# Patient Record
Sex: Female | Born: 1935
Health system: Southern US, Community
[De-identification: ages and names within clinical notes are randomized; demographics above are authoritative.]

## PROBLEM LIST (undated history)

## (undated) DIAGNOSIS — K259 Gastric ulcer, unspecified as acute or chronic, without hemorrhage or perforation: Secondary | ICD-10-CM

## (undated) DIAGNOSIS — M549 Dorsalgia, unspecified: Secondary | ICD-10-CM

## (undated) DIAGNOSIS — Z860100 Personal history of colon polyps, unspecified: Secondary | ICD-10-CM

## (undated) DIAGNOSIS — K5790 Diverticulosis of intestine, part unspecified, without perforation or abscess without bleeding: Secondary | ICD-10-CM

## (undated) DIAGNOSIS — D509 Iron deficiency anemia, unspecified: Secondary | ICD-10-CM

## (undated) DIAGNOSIS — K219 Gastro-esophageal reflux disease without esophagitis: Secondary | ICD-10-CM

## (undated) DIAGNOSIS — M2540 Effusion, unspecified joint: Secondary | ICD-10-CM

## (undated) DIAGNOSIS — G629 Polyneuropathy, unspecified: Secondary | ICD-10-CM

## (undated) DIAGNOSIS — K648 Other hemorrhoids: Secondary | ICD-10-CM

## (undated) DIAGNOSIS — I10 Essential (primary) hypertension: Secondary | ICD-10-CM

## (undated) DIAGNOSIS — M2550 Pain in unspecified joint: Secondary | ICD-10-CM

## (undated) DIAGNOSIS — M1990 Unspecified osteoarthritis, unspecified site: Secondary | ICD-10-CM

## (undated) DIAGNOSIS — Z8719 Personal history of other diseases of the digestive system: Secondary | ICD-10-CM

## (undated) DIAGNOSIS — R600 Localized edema: Secondary | ICD-10-CM

## (undated) DIAGNOSIS — M8580 Other specified disorders of bone density and structure, unspecified site: Secondary | ICD-10-CM

## (undated) DIAGNOSIS — K222 Esophageal obstruction: Secondary | ICD-10-CM

## (undated) DIAGNOSIS — I341 Nonrheumatic mitral (valve) prolapse: Secondary | ICD-10-CM

## (undated) DIAGNOSIS — Z889 Allergy status to unspecified drugs, medicaments and biological substances status: Secondary | ICD-10-CM

## (undated) DIAGNOSIS — D171 Benign lipomatous neoplasm of skin and subcutaneous tissue of trunk: Secondary | ICD-10-CM

## (undated) DIAGNOSIS — M4800 Spinal stenosis, site unspecified: Secondary | ICD-10-CM

## (undated) DIAGNOSIS — R609 Edema, unspecified: Secondary | ICD-10-CM

## (undated) DIAGNOSIS — Z87440 Personal history of urinary (tract) infections: Secondary | ICD-10-CM

## (undated) HISTORY — DX: Spinal stenosis, site unspecified: M48.00

## (undated) HISTORY — DX: Esophageal obstruction: K22.2

## (undated) HISTORY — PX: ESOPHAGOGASTRODUODENOSCOPY: SHX1529

## (undated) HISTORY — DX: Essential (primary) hypertension: I10

## (undated) HISTORY — DX: Nonrheumatic mitral (valve) prolapse: I34.1

## (undated) HISTORY — PX: IRRIGATION AND DEBRIDEMENT SEBACEOUS CYST: SHX5255

## (undated) HISTORY — DX: Iron deficiency anemia, unspecified: D50.9

## (undated) HISTORY — DX: Diverticulosis of intestine, part unspecified, without perforation or abscess without bleeding: K57.90

## (undated) HISTORY — DX: Gastro-esophageal reflux disease without esophagitis: K21.9

## (undated) HISTORY — PX: COLONOSCOPY: SHX174

## (undated) HISTORY — DX: Other hemorrhoids: K64.8

## (undated) HISTORY — DX: Unspecified osteoarthritis, unspecified site: M19.90

## (undated) HISTORY — DX: Polyneuropathy, unspecified: G62.9

## (undated) HISTORY — PX: REPLACEMENT TOTAL KNEE: SUR1224

## (undated) HISTORY — DX: Benign lipomatous neoplasm of skin and subcutaneous tissue of trunk: D17.1

## (undated) HISTORY — DX: Other specified disorders of bone density and structure, unspecified site: M85.80

## (undated) HISTORY — PX: BACK SURGERY: SHX140

---

## 1973-10-19 HISTORY — PX: BREAST EXCISIONAL BIOPSY: SUR124

## 1998-02-12 ENCOUNTER — Other Ambulatory Visit: Admission: RE | Admit: 1998-02-12 | Discharge: 1998-02-12 | Payer: Self-pay | Admitting: Gynecology

## 1999-04-28 ENCOUNTER — Other Ambulatory Visit: Admission: RE | Admit: 1999-04-28 | Discharge: 1999-04-28 | Payer: Self-pay | Admitting: Gynecology

## 2000-06-03 ENCOUNTER — Encounter: Payer: Self-pay | Admitting: Geriatric Medicine

## 2000-06-03 ENCOUNTER — Encounter: Admission: RE | Admit: 2000-06-03 | Discharge: 2000-06-03 | Payer: Self-pay | Admitting: Geriatric Medicine

## 2000-06-15 ENCOUNTER — Encounter: Admission: RE | Admit: 2000-06-15 | Discharge: 2000-06-15 | Payer: Self-pay | Admitting: Geriatric Medicine

## 2000-06-15 ENCOUNTER — Encounter: Payer: Self-pay | Admitting: Geriatric Medicine

## 2000-06-17 ENCOUNTER — Other Ambulatory Visit: Admission: RE | Admit: 2000-06-17 | Discharge: 2000-06-17 | Payer: Self-pay | Admitting: Gynecology

## 2002-11-29 ENCOUNTER — Encounter: Admission: RE | Admit: 2002-11-29 | Discharge: 2002-11-29 | Payer: Self-pay | Admitting: Geriatric Medicine

## 2002-11-29 ENCOUNTER — Encounter: Payer: Self-pay | Admitting: Geriatric Medicine

## 2002-12-07 ENCOUNTER — Encounter: Payer: Self-pay | Admitting: Geriatric Medicine

## 2002-12-07 ENCOUNTER — Encounter: Admission: RE | Admit: 2002-12-07 | Discharge: 2002-12-07 | Payer: Self-pay | Admitting: Geriatric Medicine

## 2003-02-21 ENCOUNTER — Other Ambulatory Visit: Admission: RE | Admit: 2003-02-21 | Discharge: 2003-02-21 | Payer: Self-pay | Admitting: Geriatric Medicine

## 2003-05-23 ENCOUNTER — Ambulatory Visit (HOSPITAL_BASED_OUTPATIENT_CLINIC_OR_DEPARTMENT_OTHER): Admission: RE | Admit: 2003-05-23 | Discharge: 2003-05-23 | Payer: Self-pay | Admitting: *Deleted

## 2004-05-22 ENCOUNTER — Encounter: Admission: RE | Admit: 2004-05-22 | Discharge: 2004-05-22 | Payer: Self-pay | Admitting: Geriatric Medicine

## 2004-09-30 ENCOUNTER — Ambulatory Visit: Payer: Self-pay | Admitting: Internal Medicine

## 2004-12-01 ENCOUNTER — Ambulatory Visit (HOSPITAL_COMMUNITY): Admission: RE | Admit: 2004-12-01 | Discharge: 2004-12-01 | Payer: Self-pay | Admitting: Internal Medicine

## 2004-12-12 ENCOUNTER — Encounter: Admission: RE | Admit: 2004-12-12 | Discharge: 2004-12-12 | Payer: Self-pay | Admitting: Geriatric Medicine

## 2005-02-24 ENCOUNTER — Other Ambulatory Visit: Admission: RE | Admit: 2005-02-24 | Discharge: 2005-02-24 | Payer: Self-pay | Admitting: Geriatric Medicine

## 2005-03-13 ENCOUNTER — Encounter: Admission: RE | Admit: 2005-03-13 | Discharge: 2005-03-13 | Payer: Self-pay | Admitting: Geriatric Medicine

## 2005-10-07 ENCOUNTER — Encounter: Admission: RE | Admit: 2005-10-07 | Discharge: 2005-10-07 | Payer: Self-pay | Admitting: Geriatric Medicine

## 2006-01-05 ENCOUNTER — Inpatient Hospital Stay (HOSPITAL_COMMUNITY): Admission: RE | Admit: 2006-01-05 | Discharge: 2006-01-06 | Payer: Self-pay | Admitting: Neurosurgery

## 2006-02-07 ENCOUNTER — Encounter: Admission: RE | Admit: 2006-02-07 | Discharge: 2006-02-07 | Payer: Self-pay | Admitting: Neurosurgery

## 2006-02-09 ENCOUNTER — Encounter: Admission: RE | Admit: 2006-02-09 | Discharge: 2006-02-09 | Payer: Self-pay | Admitting: Geriatric Medicine

## 2006-02-15 ENCOUNTER — Ambulatory Visit (HOSPITAL_COMMUNITY): Admission: RE | Admit: 2006-02-15 | Discharge: 2006-02-16 | Payer: Self-pay | Admitting: Neurosurgery

## 2006-03-18 ENCOUNTER — Encounter: Admission: RE | Admit: 2006-03-18 | Discharge: 2006-03-18 | Payer: Self-pay | Admitting: Neurosurgery

## 2006-08-25 ENCOUNTER — Inpatient Hospital Stay (HOSPITAL_COMMUNITY): Admission: RE | Admit: 2006-08-25 | Discharge: 2006-08-29 | Payer: Self-pay | Admitting: Orthopaedic Surgery

## 2006-09-14 ENCOUNTER — Inpatient Hospital Stay (HOSPITAL_COMMUNITY): Admission: EM | Admit: 2006-09-14 | Discharge: 2006-09-16 | Payer: Self-pay | Admitting: Emergency Medicine

## 2007-03-24 ENCOUNTER — Encounter: Admission: RE | Admit: 2007-03-24 | Discharge: 2007-03-24 | Payer: Self-pay | Admitting: Geriatric Medicine

## 2008-04-26 ENCOUNTER — Encounter: Admission: RE | Admit: 2008-04-26 | Discharge: 2008-04-26 | Payer: Self-pay | Admitting: Geriatric Medicine

## 2008-05-10 ENCOUNTER — Encounter: Admission: RE | Admit: 2008-05-10 | Discharge: 2008-05-10 | Payer: Self-pay | Admitting: Geriatric Medicine

## 2008-06-23 ENCOUNTER — Emergency Department (HOSPITAL_COMMUNITY): Admission: EM | Admit: 2008-06-23 | Discharge: 2008-06-23 | Payer: Self-pay | Admitting: Emergency Medicine

## 2008-11-27 ENCOUNTER — Other Ambulatory Visit: Admission: RE | Admit: 2008-11-27 | Discharge: 2008-11-27 | Payer: Self-pay | Admitting: Geriatric Medicine

## 2009-05-20 ENCOUNTER — Encounter: Admission: RE | Admit: 2009-05-20 | Discharge: 2009-05-20 | Payer: Self-pay | Admitting: Geriatric Medicine

## 2009-06-03 ENCOUNTER — Encounter: Admission: RE | Admit: 2009-06-03 | Discharge: 2009-06-03 | Payer: Self-pay | Admitting: Geriatric Medicine

## 2010-03-28 ENCOUNTER — Encounter (INDEPENDENT_AMBULATORY_CARE_PROVIDER_SITE_OTHER): Payer: Self-pay | Admitting: *Deleted

## 2010-06-26 ENCOUNTER — Encounter: Admission: RE | Admit: 2010-06-26 | Discharge: 2010-06-26 | Payer: Self-pay | Admitting: Geriatric Medicine

## 2010-11-10 ENCOUNTER — Encounter: Payer: Self-pay | Admitting: Geriatric Medicine

## 2010-11-18 NOTE — Letter (Signed)
Summary: Colonoscopy Date Change Letter  [facility]  [address] [place], [place] [address]   Phone: [phone]  Fax: [phone]      March 28, 2010 MRN: [id number]   [facility] [place] [address] [place], [place]  [address]   Dear Ms. Fulghum,   Previously you were recommended to have a repeat colonoscopy around this time. Your chart was recently reviewed by Dr. Judie Petit T. Russella Dar of [facility]. Follow up colonoscopy is now recommended in July 2014. This revised recommendation is based on current, nationally recognized guidelines for colorectal cancer screening and polyp surveillance. These guidelines are endorsed by the American Cancer Society, [employer] on Colorectal Cancer as well as numerous other major medical organizations.  Please understand that our recommendation assumes that you do not have any new symptoms such as bleeding, a change in bowel habits, anemia, or significant abdominal discomfort. If you do have any concerning GI symptoms or want to discuss the guideline recommendations, please call to arrange an office visit at your earliest convenience. Otherwise we will keep you in our reminder system and contact you 1-2 months prior to the date listed above to schedule your next colonoscopy.  Thank you,  Judie Petit T. Russella Dar, M.D.  [facility] [phone]

## 2011-03-06 NOTE — Op Note (Signed)
NAME:  Ashley Cabrera, BHAT              ACCOUNT NO.:  [id number]   MEDICAL RECORD NO.:  [id number]          PATIENT TYPE:  INP   LOCATION:  NA                           FACILITY:  [facility]   PHYSICIAN:  Henry A. Pool, M.D.    DATE OF BIRTH:  [birth year]   DATE OF PROCEDURE:  01/05/2006  DATE OF DISCHARGE:                                 OPERATIVE REPORT   PREOPERATIVE DIAGNOSIS:  L4-5 stenosis.   POSTOPERATIVE DIAGNOSIS:  L4-5 stenosis.   OPERATION PERFORMED:  L4-5 bilateral decompressive laminotomies with  foraminotomies.   SURGEON:  Kathaleen Maser. Pool, M.D.   ASSISTANT:  Reinaldo Meeker, M.D.   ANESTHESIA:  General oral endotracheal.   INDICATIONS FOR PROCEDURE:  The patient is a 77 year old female with history  of back and bilateral lower extremity pain failing conservative management.  Work-up demonstrates evidence of marked multilevel spondylosis with severe  stenosis at the L4-5 level.  The patient presents now for L4-5 decompression  in hopes of improving her symptoms.   DESCRIPTION OF PROCEDURE:  The patient was taken to the operating room and  placed on the table in the supine position.  After adequate level of  anesthesia was achieved, the patient was positioned prone onto a Wilson  frame and appropriately padded.  The patient's lumbar region was prepped and  draped sterilely.  A 10 blade was used to make a linear skin incision  overlying the L4-5 interspace.  This was carried down sharply in the  midline.  A subperiosteal dissection was then performed exposing the lamina  and facet joints of L4 and L5.  Deep self-retaining retractor was placed.  Intraoperative x-ray was taken, the level was confirmed.  The laminotomy was  then performed bilaterally using high speed drill and Kerrison rongeurs to  remove the inferior edge of the lamina at L4, medial aspect of the L4-5  facet joint and superior rim of the L5 lamina.  Ligamentum flavum was then  elevated and resected in piecemeal  fashion using Kerrison rongeurs.  Underlying thecal sac and exiting L4 and L5 nerve roots were identified  bilaterally.  Wide decompressive foraminotomies were performed along the  course of the exiting nerve roots bilaterally.  At this point a very  thorough diskectomy had been performed.  There was no evidence of injury to  thecal sac or nerve roots.  The wound was then irrigated with antibiotic  solution.  Gelfoam was placed topically for hemostasis which was found to be  good.  Microscope and retractor system were removed.  Hemostasis in muscle  was achieved with electrocautery.  The wound was then closed in layers with  Vicryl sutures.  Steri-Strips and sterile dressing were applied.  There were  no apparent complications.  The patient tolerated the procedure well and she  returned to the recovery room postoperatively.          ______________________________  Kathaleen Maser Pool, M.D.    HAP/MEDQ  D:  01/05/2006  T:  01/06/2006  Job:  [id number]

## 2011-03-06 NOTE — H&P (Signed)
NAME:  Ashley Cabrera              ACCOUNT NO.:  [id number]   MEDICAL RECORD NO.:  [id number]          PATIENT TYPE:  INP   LOCATION:  5739                         FACILITY:  [facility]   PHYSICIAN:  Hal T. Stoneking, M.D. DATE OF BIRTH:  11-28-1933   DATE OF ADMISSION:  09/14/2006  DATE OF DISCHARGE:                                HISTORY & PHYSICAL   .   IDENTIFYING DATA:  Ashley Cabrera is a very nice 77 year old white female who  lives at home independently.  Ashley Cabrera was in her usual state of health  about 3 weeks ago. She had a left total knee replacement and has been at  home doing well with physical therapy.  Last evening she noted a burning  sensation on urination and today felt poorly, had a fever as high as 102.  She had nausea and vomiting at least once today, really did not eat or drink  today.  She stood up to reach for her walker, lost her balance, recalled the  fall.  Came to the emergency room for evaluation.  During the time she had  the fever, she was somewhat disoriented, was unable to speak very clearly,  and she recalls this episode.  She has had no diarrhea.  No cough.   ALLERGIES:  She has no known drug allergies.   CURRENT MEDICATIONS:  1. Prilosec over-the-counter 20 mg a day.  2. Cartia XT 120 mg a day.  3. Singulair 10 mg a day.  4. Coumadin, which she just completed yesterday.  5. Darvocet-N 100 p.r.n.  6. Phenergan p.r.n. nausea and vomiting.   PAST MEDICAL HISTORY:  Remarkable for:  1. GERD.  2. Mitral valve prolapse with a history of palpitations for which she      takes Cartia XT.  3. Allergic rhinitis.   She has had no history of MI, stroke or cancer.   PAST SURGERIES:  1. She has had two back surgeries.  2. Arthroscopic knee surgery.  3. Recent total knee replacement by Dr. Ophelia Charter.   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  Does not drink, does not consume alcohol.  She has two children, both  adopted.   FAMILY HISTORY:  Her mother died with lymphoma.  Father was my patient. Mr.  Mack Cabrera died of prostate cancer, had hypertension.  Brother, Ashley Cabrera, is also  my patient and has had hypertension, memory loss and a transverse myelitis.   REVIEW OF SYSTEMS:  She has had a frontal headache.  No change in vision or  hearing.  No trouble chewing or swallowing. A little bit of a dry,  nonproductive cough.  She has had no chest pain, no abdominal pain.  No  change in bowel habits.   PHYSICAL EXAMINATION:  VITAL SIGNS: Temperature 98.2, pulse 100, blood  pressure 123/54.  SKIN: Warm and dry.  HEENT: Pupils equal and reactive to light.  Disks are sharp. Vasculature  appears normal.  TMs normal.  Oropharynx clear.  LUNGS: Clear.  HEART: Regular rate and rhythm without murmurs, rubs or gallops.  ABDOMEN: Soft.  No pedal splenomegaly or masses palpated.  EXTREMITIES: No lower extremity edema.  Examination of left knee reveals the  incision to be healed.  No erythema or warmth.   LABORATORY DATA:  Urinalysis revealed it too-numerous-to-count wbc's. CBC:  White count elevated at 24,000 with 87% neutrophils, 4% lymphs, 9%  monocytes.  Sodium 136, potassium 3.9, chloride 102, bicarb 21, glucose 141,  BUN 16, creatinine 1.1, calcium 9.3, total protein 6.9, albumin 3.4, SGOT  33, SGPT 32, alkaline phosphatase 73.   Chest x-ray revealed no active disease.   INR 1.7.   ASSESSMENT:  1. Febrile illness, likely due to urinary tract infection and      pyelonephritis due to recent Foley catheter.  This resulted in fever,      dehydration and a brief episode of delirium which is not clear.  2. Status post left total knee replacement. Looks very good. Will need to      continue her physical therapy. See no further need continue the      Coumadin as she has been up and ambulating.   PLAN:  1. Will admit.  2. Will begin IV Cipro.  3. Culture, urine.  4. Hydrate.  5. PT consult.  6. Switch to p.o. Cipro when  doing well and then discharge home.           ______________________________  Hal T. Pete Glatter, M.D.     HTS/MEDQ  D:  09/14/2006  T:  09/15/2006  Job:  [id number]   cc:   Loraine Leriche C. Ophelia Charter, M.D.

## 2011-03-06 NOTE — Discharge Summary (Signed)
NAME:  Ashley Cabrera, Ashley Cabrera              ACCOUNT NO.:  [id number]   MEDICAL RECORD NO.:  [id number]          PATIENT TYPE:  INP   LOCATION:  5003                         FACILITY:  [facility]   PHYSICIAN:  Mark C. Ophelia Charter, M.D.    DATE OF BIRTH:  03-31-1934   DATE OF ADMISSION:  08/25/2006  DATE OF DISCHARGE:  08/29/2006                               DISCHARGE SUMMARY   FINAL DIAGNOSIS:  Left knee osteoarthritis.   ADDITIONAL DIAGNOSES:  1. Postoperative anemia secondary to blood loss.  2. Diaphragmatic hernia.  3. Mitral valve disorder.   PROCEDURE:  Left total knee arthroplasty, that was on August 25, 2006.   HISTORY:  This is a 77 year old female with progressive tricompartmental  degenerative arthritis and had previous arthroscopy in August with  debridement of degenerative meniscal tears and has not been able to  progress due to recurrent effusion, pain, catching and giving way on  bone-on-bone changes, worse on lateral than _medial_________  compartment with large osteophytes and subchondral sclerosis.  She was  admitted for total knee arthroplasty.  This was computer-assisted  cemented  Dupuy rotating platform done August 25, 2006.  Postoperatively, she received preoperative antibiotics, postoperative  DVT prophylaxis with Coumadin, OT/PT consultation, home care needs,  physical therapy.  She was mobilized gradually using CPM, taking 5 mg of  Coumadin a day.  Home health care was arranged.  She was independent  ambulation at the time of discharge and was ambulatory in the halls.   FOLLOWUP:  To follow up 2 weeks from the day of discharge.   CONDITION ON DISCHARGE:  Improved.      Mark C. Ophelia Charter, M.D.  Electronically Signed     MCY/MEDQ  D:  10/29/2006  T:  10/30/2006  Job:  [id number]

## 2011-03-06 NOTE — Op Note (Signed)
   NAME:  Lesperance, GAYNELL Elbert Ewings                         ACCOUNT NO.:  [id number]   MEDICAL RECORD NO.:  [id number]                   PATIENT TYPE:  AMB   LOCATION:  DSC                                  FACILITY:  [facility]   PHYSICIAN:  Vikki Ports, M.D.         DATE OF BIRTH:  1934/06/09   DATE OF PROCEDURE:  05/23/2003  DATE OF DISCHARGE:                                 OPERATIVE REPORT   PREOPERATIVE DIAGNOSIS:  Epidermoid cyst of the chest wall.   POSTOPERATIVE DIAGNOSIS:  Epidermoid cyst of the chest wall.   PROCEDURE:  Excision of epidermoid cyst of the chest wall.   ANESTHESIA:  Local.   SURGEON:  Vikki Ports, M.D.   DESCRIPTION OF PROCEDURE:  The patient was taken to the minor room at [facility]  Day [facility].  The chest was prepped and draped in normal sterile fashion.  1% lidocaine with bicarb was used to anesthetize the area.  An elliptical  incision was made around the cyst and dissected down to normal subcutaneous  fat.  The cyst was excised in its entirety and the wound was closed with  subcuticular 4-0 Monocryl.  Steri-Strips and sterile dressings were applied.  The patient tolerated the procedure well.                                               Vikki Ports, M.D.    KRH/MEDQ  D:  05/23/2003  T:  05/23/2003  Job:  [id number]

## 2011-03-06 NOTE — Op Note (Signed)
NAME:  Ashley Cabrera, Ashley Cabrera              ACCOUNT NO.:  [id number]   MEDICAL RECORD NO.:  [id number]          PATIENT TYPE:  OIB   LOCATION:  3036                         FACILITY:  [facility]   PHYSICIAN:  Sherilyn Cooter A. Pool, M.D.    DATE OF BIRTH:  1934-08-23   DATE OF PROCEDURE:  02/15/2006  DATE OF DISCHARGE:                                 OPERATIVE REPORT   SERVICE:  Neurosurgery.   PREOPERATIVE DIAGNOSIS:  Right L4-5 recurrent herniated pulposus with  radiculopathy.   POSTOPERATIVE DIAGNOSIS:  Right L4-5 recurrent herniated pulposus with  radiculopathy.   PROCEDURE:  Re-exploration of right L4-5 laminotomy with a redo  microdiskectomy.   SURGEON:  Kathaleen Maser. Pool, M.D.   ANESTHESIA:  General oroendotracheal.   INDICATIONS:  [name] is a 76 year old female who is status post a  bilateral L4-5 decompressive laminotomy with foraminotomies approximately 1  month ago.  The patient initially did well, but now has developed severe  right lower extremity radicular pain.  Workup demonstrates evidence of a new  right-sided L4-5 disk herniation with a superior free fragment causing  marked compression of the right sided L4 and L5 nerve roots.  The patient  has been as her options.  She has decided proceed with a right-sided L4-5  redo microdiskectomy in hopes of improving her symptoms.   OPERATIVE NOTE:  The patient was brought to the operating room and placed in  a supine position.  After an adequate level of anesthesia was achieved, the  patient was positioned prone onto a Wilson frame and appropriately padded.  The patient's lumbar region was shaved and prepped sterilely.  A 10 blade  was used to make a linear skin incision overlying the L4-5 interspace.  This  was carried down sharply in the midline.  Subperiosteal dissection was then  performed, exposing the lamina and facet joints at L4-5 on the right side.  A deep self-retaining retractor was placed.  Intraoperative x-rays were  taken  and level was confirmed.  Previous laminotomy was then dissected free  using dental instruments.  Epidural scar was resected.  Underlying thecal  sac and exiting L5 nerve roots were applied.  The microscope was brought  into the field to use for microdissection of the right-sided L5 nerve root  and underlying disk herniation.  Epidural venous plexus was coagulated and  cut.  Thecal sac and L5 nerve root were mobilized and retracted towards the  midline.  Disk space was isolate and incised with a 15 blade in a  rectangular fashion.  A wide disk space cleanout was achieved using  pituitary rongeurs, upward-angled pituitary rongeurs and Epstein curettes.  A large amount of calcified superior fragmented disk was encountered and  completely resected.  A free fragment of disk was also encountered and  completely resected.  At this point a very thorough diskectomy had been  achieved.  There was no injury to the thecal sac or nerve roots.  There was  no residual compression upon the thecal sac and nerve roots.  Wound was then  irrigated with antibiotic solution.  Gelfoam was placed intraoperatively  for  hemostasis, which was found to be good.  Microscope and retractor system  were removed.  Hemostasis in the  muscle was achieved with electrocautery.  Wound was closed in layers with  Vicryl sutures.  Steri-Strips and a sterile dressing were applied.  There  were no apparent intraoperative complications.  The patient tolerated the  procedure well and she returns to the recovery room postoperatively.           ______________________________  Kathaleen Maser Pool, M.D.     HAP/MEDQ  D:  02/15/2006  T:  02/16/2006  Job:  [id number]

## 2011-03-06 NOTE — Op Note (Signed)
NAME:  Ashley Cabrera, Ashley Cabrera              ACCOUNT NO.:  [id number]   MEDICAL RECORD NO.:  [id number]          PATIENT TYPE:  INP   LOCATION:  5003                         FACILITY:  [facility]   PHYSICIAN:  Mark C. Ophelia Charter, M.D.    DATE OF BIRTH:  Mar 05, 1934   DATE OF PROCEDURE:  08/25/2006  DATE OF DISCHARGE:                                 OPERATIVE REPORT   PREOPERATIVE DIAGNOSIS:  Left knee osteoarthritis.   POSTOPERATIVE DIAGNOSIS:  Left knee osteoarthritis.   PROCEDURE:  Left total knee arthroplasty, computer assist, cemented.   SURGEON:  Mark C. Ophelia Charter, M.D.   ASSISTANT:  Wende Neighbors, P.A.   ANESTHESIA:  Prep with femoral nerve block plus general anesthesia and  Marcaine skin local.   COMPONENTS USED:  DePuy, [employer], #4 femur, #3  tibia, 10 mm rotating polyethylene spacer.   PROCEDURE:  After induction of general anesthesia and orotracheal  intubation, proximal thigh tourniquet, lateral post and heel pump. Standard  prep and drape was performed with DuraPrep.  Total knee sheets, drapes,  impervious stockinette, Coban.  The leg was wrapped in Esmarch prior to  tourniquet inflation and Betadine Biodrape was applied after sterile skin  marker.   A straight anterior incision was made, superficial retinaculum was  developed.  Medial parapatellar incision was made splitting the quad between  the medial one-third and lateral two-thirds.  The patella was flipped over  and 9 mm __________ removed off the patella from facet to facet with  oscillating saw.  A 35 mm patella is appropriate, holes were drilled.  Various views for the distal femoral cut taking initially 10 mm which  appeared insufficient, an additional 2 mm were taken.  Sizing show the  appropriate #4 and after computer gave appropriate femoral rotation based on  epicondyle axis, keyhole punches were made followed by chamfer cuts and  notch cut.  On the tibial side, #3 was appropriate based on  the computer  recommendations.  A guide was used and position was within 0.5 mm of  perfect, varus-valgus was perfect and extension was neutral.  Cut was made,  there was appropriate sizing for a 3, keyhole was punched.  Trials were  inserted, it was a little bit tight in extension, medial lateral ligament  balancing was good and some meniscal remnants were removed posteriorly which  allowed for full extension and posterior capsule was stripped slightly off  the femur.  Some bone spurs removed posteriorly off the femur.  Pulsatile  lavage was used.  Cement was vacuum mixed.  Tibia was cemented first  followed by femur, spacer insertion and patella with patellar clamp.  All  excessive cement had been removed.  The computer said there was full  extension, 0.5 degrees valgus and balancing was less than 1 mm.  Once the  cement was hard, tourniquet was deflated and  hemostasis was obtained.  Deep capsule closed with absorbable #1 suture, 2-0  Vicryl superficial retinaculum, subcutaneous tissue, subcuticular skin  closure.  Marcaine infiltration, tincture of benzoin, Steri-Strips, postop  dressing and knee immobilizer.  Instrument count, needle  count correct.      Mark C. Ophelia Charter, M.D.  Electronically Signed     MCY/MEDQ  D:  08/25/2006  T:  08/26/2006  Job:  [id number]

## 2011-07-06 ENCOUNTER — Other Ambulatory Visit: Payer: Self-pay | Admitting: Geriatric Medicine

## 2011-07-06 DIAGNOSIS — Z1231 Encounter for screening mammogram for malignant neoplasm of breast: Secondary | ICD-10-CM

## 2011-07-14 ENCOUNTER — Ambulatory Visit: Payer: Self-pay

## 2011-07-22 ENCOUNTER — Ambulatory Visit
Admission: RE | Admit: 2011-07-22 | Discharge: 2011-07-22 | Disposition: A | Discharge status: Home / Self Care | Payer: Medicare Other | Source: Ambulatory Visit | Attending: Geriatric Medicine | Admitting: Geriatric Medicine

## 2011-07-22 DIAGNOSIS — Z1231 Encounter for screening mammogram for malignant neoplasm of breast: Secondary | ICD-10-CM

## 2011-07-22 LAB — COMPREHENSIVE METABOLIC PANEL
ALT: 24
AST: 29
Alkaline Phosphatase: 61
CO2: 23
Chloride: 102
GFR calc Af Amer: >60
GFR calc non Af Amer: >60
Sodium: 136
Total Bilirubin: 1.2

## 2011-07-22 LAB — URINALYSIS, ROUTINE W REFLEX MICROSCOPIC
Hgb urine dipstick: NEGATIVE
Nitrite: NEGATIVE

## 2011-07-22 LAB — CBC
MCV: 90.1
Platelets: 253
RBC: 4.31
RDW: 12.7
WBC: 13.7 — ABNORMAL HIGH

## 2011-07-22 LAB — CULTURE, BLOOD (ROUTINE X 2)

## 2011-07-22 LAB — DIFFERENTIAL
Basophils Absolute: 0
Basophils Relative: 0
Eosinophils Absolute: 0.1
Eosinophils Relative: 1

## 2012-02-01 DIAGNOSIS — G609 Hereditary and idiopathic neuropathy, unspecified: Secondary | ICD-10-CM | POA: Diagnosis not present

## 2012-02-01 DIAGNOSIS — Z1331 Encounter for screening for depression: Secondary | ICD-10-CM | POA: Diagnosis not present

## 2012-02-01 DIAGNOSIS — E782 Mixed hyperlipidemia: Secondary | ICD-10-CM | POA: Diagnosis not present

## 2012-02-01 DIAGNOSIS — Z0000 Encounter for general adult medical examination without abnormal findings: Secondary | ICD-10-CM | POA: Diagnosis not present

## 2012-02-01 DIAGNOSIS — Z79899 Other long term (current) drug therapy: Secondary | ICD-10-CM | POA: Diagnosis not present

## 2012-02-01 DIAGNOSIS — I10 Essential (primary) hypertension: Secondary | ICD-10-CM | POA: Diagnosis not present

## 2012-02-01 DIAGNOSIS — D649 Anemia, unspecified: Secondary | ICD-10-CM | POA: Diagnosis not present

## 2012-02-18 ENCOUNTER — Encounter: Payer: Self-pay | Admitting: Gastroenterology

## 2012-02-18 DIAGNOSIS — G609 Hereditary and idiopathic neuropathy, unspecified: Secondary | ICD-10-CM | POA: Diagnosis not present

## 2012-02-18 DIAGNOSIS — D509 Iron deficiency anemia, unspecified: Secondary | ICD-10-CM | POA: Diagnosis not present

## 2012-02-18 DIAGNOSIS — I10 Essential (primary) hypertension: Secondary | ICD-10-CM | POA: Diagnosis not present

## 2012-03-10 ENCOUNTER — Encounter: Payer: Self-pay | Admitting: Gastroenterology

## 2012-03-10 ENCOUNTER — Ambulatory Visit (INDEPENDENT_AMBULATORY_CARE_PROVIDER_SITE_OTHER): Payer: Medicare Other | Admitting: Gastroenterology

## 2012-03-10 VITALS — BP 134/72 | HR 80 | Ht 63.5 in | Wt 180.0 lb

## 2012-03-10 DIAGNOSIS — D509 Iron deficiency anemia, unspecified: Secondary | ICD-10-CM | POA: Diagnosis not present

## 2012-03-10 DIAGNOSIS — K219 Gastro-esophageal reflux disease without esophagitis: Secondary | ICD-10-CM | POA: Diagnosis not present

## 2012-03-10 MED ORDER — MOVIPREP 100 G PO SOLR: 1.0000 | Freq: Once | ORAL | Status: DC

## 2012-03-10 NOTE — Progress Notes (Signed)
History of Present Illness: This is a 78 year old female was recently found to have iron deficiency anemia: Hemoglobin 11.7, iron 46, transferrin 375, iron saturation 9%. She has a history of chronic GERD with recurrent peptic esophageal strictures in the 80s and 90s. She last underwent endoscopy with dilation in 1994. Her reflux symptoms are under excellent control and she has not had recurrent dysphagia. Her last colonoscopy was performed for routine screening in July 2004 showing sigmoid colon diverticulosis and internal hemorrhoids. Denies weight loss, abdominal pain, constipation, diarrhea, change in stool caliber, melena, hematochezia, nausea, vomiting, dysphagia, reflux symptoms, chest pain.  Allergies  Allergen Reactions  . Vicodin (Hydrocodone-Acetaminophen)    Outpatient Prescriptions Prior to Visit  Medication Sig Dispense Refill  . aspirin 81 MG tablet Take 81 mg by mouth daily.      . Calcium Carbonate-Vitamin D (CALTRATE 600+D PO) Take 1 tablet by mouth daily.      . diclofenac sodium (VOLTAREN) 1 % GEL Apply 1 application topically every 6 (six) hours.      Marland Kitchen diltiazem (DILACOR XR) 240 MG 24 hr capsule Take 240 mg by mouth daily.      . hydrochlorothiazide (MICROZIDE) 12.5 MG capsule Take 12.5 mg by mouth daily.      . Multiple Vitamin (MULTIVITAMIN) tablet Take 1 tablet by mouth daily.      Marland Kitchen omeprazole (PRILOSEC) 20 MG capsule Take 20 mg by mouth daily.      . pregabalin (LYRICA) 25 MG capsule Take 25 mg by mouth 2 (two) times daily.      . Cholecalciferol (VITAMIN D) 2000 UNITS CAPS Take 1 capsule by mouth daily.       Past Medical History  Diagnosis Date  . MVP (mitral valve prolapse)   . Arthritis   . GERD (gastroesophageal reflux disease)   . Peptic stricture of esophagus   . Diverticulosis   . Internal hemorrhoids   . Hypertension   . Breast lipoma     right  . Osteopenia   . Spinal stenosis   . Peripheral neuropathy   . Allergic rhinitis   . Asthma   . Iron  deficiency anemia    Past Surgical History  Procedure Date  . Breast excisional biopsy 1975    Benign right breast  . Irrigation and debridement sebaceous cyst 2004  . Back surgery 2007  . Replacement total knee 2007    left   History   Social History  . Marital Status: Widowed    Spouse Name: N/A    Number of Children: 2 A  . Years of Education: N/A   Occupational History  . Unemployed    Social History Main Topics  . Smoking status: Never Smoker   . Smokeless tobacco: Never Used  . Alcohol Use: No  . Drug Use: No  . Sexually Active: None   Other Topics Concern  . None   Social History Narrative  . None   Family History  Problem Relation Age of Onset  . Hypertension Father   . Lymphoma Mother   . Dementia Brother   . Hypertension Brother   . Coronary artery disease Brother   . Hypertension Sister     Review of Systems: Pertinent positive and negative review of systems were noted in the above HPI section. All other review of systems were otherwise negative.  Physical Exam: General: Well developed , well nourished, no acute distress Head: Normocephalic and atraumatic Eyes:  sclerae anicteric, EOMI Ears: Normal auditory acuity  Mouth: No deformity or lesions Neck: Supple, no masses or thyromegaly Lungs: Clear throughout to auscultation Heart: Regular rate and rhythm; no murmurs, rubs or bruits Abdomen: Soft, non tender and non distended. No masses, hepatosplenomegaly or hernias noted. Normal Bowel sounds Rectal: Deferred to colonoscopy Musculoskeletal: Symmetrical with no gross deformities  Skin: No lesions on visible extremities Pulses:  Normal pulses noted Extremities: No clubbing, cyanosis, edema or deformities noted Neurological: Alert oriented x 4, grossly nonfocal Cervical Nodes:  No significant cervical adenopathy Inguinal Nodes: No significant inguinal adenopathy Psychological:  Alert and cooperative. Normal mood and affect  Assessment and  Recommendations:  1. Iron deficiency anemia.  Rule out occult gastrointestinal losses. Continue daily iron and followup with Dr. Pete Glatter in 2-3 months. The risks, benefits, and alternatives to colonoscopy with possible biopsy and possible polypectomy were discussed with the patient and they consent to proceed. The risks, benefits, and alternatives to endoscopy with possible biopsy and possible dilation were discussed with the patient and they consent to proceed.   2. History of chronic GERD with prior esophageal strictures. Continue omeprazole 20 mg daily and standard antireflux measures.

## 2012-03-10 NOTE — Patient Instructions (Signed)
You have been scheduled for an endoscopy and colonoscopy with propofol. Please follow the written instructions given to you at your visit today. Please pick up your prep at the pharmacy within the next 1-3 days. Stop taking Iron supplement 5 days before your procedure. cc: Merlene Laughter, MD

## 2012-03-23 DIAGNOSIS — H43399 Other vitreous opacities, unspecified eye: Secondary | ICD-10-CM | POA: Diagnosis not present

## 2012-03-23 DIAGNOSIS — H524 Presbyopia: Secondary | ICD-10-CM | POA: Diagnosis not present

## 2012-03-23 DIAGNOSIS — H35039 Hypertensive retinopathy, unspecified eye: Secondary | ICD-10-CM | POA: Diagnosis not present

## 2012-03-23 DIAGNOSIS — H2510 Age-related nuclear cataract, unspecified eye: Secondary | ICD-10-CM | POA: Diagnosis not present

## 2012-03-29 DIAGNOSIS — M25569 Pain in unspecified knee: Secondary | ICD-10-CM | POA: Diagnosis not present

## 2012-03-29 DIAGNOSIS — M1710 Unilateral primary osteoarthritis, unspecified knee: Secondary | ICD-10-CM | POA: Diagnosis not present

## 2012-04-06 ENCOUNTER — Other Ambulatory Visit: Payer: Self-pay | Admitting: Gastroenterology

## 2012-04-06 ENCOUNTER — Encounter: Payer: Self-pay | Admitting: Gastroenterology

## 2012-04-06 ENCOUNTER — Ambulatory Visit (AMBULATORY_SURGERY_CENTER): Payer: Medicare Other | Admitting: Gastroenterology

## 2012-04-06 VITALS — BP 144/84 | HR 75 | Temp 97.3°F | Resp 20 | Ht 63.5 in | Wt 180.0 lb

## 2012-04-06 DIAGNOSIS — D509 Iron deficiency anemia, unspecified: Secondary | ICD-10-CM | POA: Diagnosis not present

## 2012-04-06 DIAGNOSIS — D126 Benign neoplasm of colon, unspecified: Secondary | ICD-10-CM | POA: Diagnosis not present

## 2012-04-06 DIAGNOSIS — I10 Essential (primary) hypertension: Secondary | ICD-10-CM | POA: Diagnosis not present

## 2012-04-06 DIAGNOSIS — K219 Gastro-esophageal reflux disease without esophagitis: Secondary | ICD-10-CM

## 2012-04-06 DIAGNOSIS — D131 Benign neoplasm of stomach: Secondary | ICD-10-CM

## 2012-04-06 DIAGNOSIS — J45909 Unspecified asthma, uncomplicated: Secondary | ICD-10-CM | POA: Diagnosis not present

## 2012-04-06 DIAGNOSIS — I059 Rheumatic mitral valve disease, unspecified: Secondary | ICD-10-CM | POA: Diagnosis not present

## 2012-04-06 DIAGNOSIS — D649 Anemia, unspecified: Secondary | ICD-10-CM | POA: Diagnosis not present

## 2012-04-06 MED ORDER — SODIUM CHLORIDE 0.9 % IV SOLN: 500.0000 mL | INTRAVENOUS | Status: DC

## 2012-04-06 NOTE — Op Note (Signed)
[facility] [address] [place], [place]  [address]  ENDOSCOPY PROCEDURE REPORT PATIENT:  Ashley Cabrera, Ashley Cabrera  MR#:  [id number] BIRTHDATE:  [birth year], 78 yrs. old  GENDER:  female ENDOSCOPIST:  Judie Petit T. Russella Dar, MD, [facility]  PROCEDURE DATE:  04/06/2012 PROCEDURE:  EGD with snare polypectomy ASA CLASS:  Class III INDICATIONS:  GERD, iron deficiency anemia MEDICATIONS:  MAC sedation, administered by CRNA, There was residual sedation effect present from prior procedure., propofol (Diprivan) 180 mg IV TOPICAL ANESTHETIC:  none DESCRIPTION OF PROCEDURE:   After the risks benefits and alternatives of the procedure were thoroughly explained, informed consent was obtained.  The LB GIF-H180 [id number] endoscope was introduced through the mouth and advanced to the second portion of the duodenum, without limitations.  The instrument was slowly withdrawn as the mucosa was fully examined. <<PROCEDUREIMAGES>> Multiple erosions were found in the fundus associated with the hiatal hernia. They were linear arrayed and erythematous.  There were multiple polyps identified. in the body of the stomach. They were 4 - 7 mm in size that had features typical for benign fundic gland polyps. 3 of the largest polyps were snared, then remvoed without cautery. Polyps were retrieved and sent to pathology. Otherwise normal stomach. The esophagus and gastroesophageal junction were completely normal in appearance.  The duodenal bulb was normal in appearance, as was the postbulbar duodenum. Retroflexed views revealed a hiatal hernia, 5 cm. The scope was then withdrawn from the patient and the procedure completed. COMPLICATIONS:  None  ENDOSCOPIC IMPRESSION: 1) Cameron erosions 2) Polyps, multiple in the body of the stomach 3) Moderate sized hiatal hernia  RECOMMENDATIONS: 1) Anti-reflux regimen 2) Await pathology results 3) Continue PPI 4) Cameron erosions are likely source of fe deficiency. Since  they are a chronic condition fe deficiency is very likely to recur.  Venita Lick. Russella Dar, MD, [facility]  CC:  Merlene Laughter, MD  n. Rosalie DoctorVenita Lick. Sterling Ucci at 04/06/2012 02:05 PM  Lio, Harrie Jeans, [phone]

## 2012-04-06 NOTE — Progress Notes (Signed)
Patient did not have preoperative order for IV antibiotic SSI prophylaxis. (G8918)  Patient did not experience any of the following events: a burn prior to discharge; a fall within the facility; wrong site/side/patient/procedure/implant event; or a hospital transfer or hospital admission upon discharge from the facility. (G8907)  

## 2012-04-06 NOTE — Patient Instructions (Signed)

## 2012-04-06 NOTE — Op Note (Signed)
[facility] [address] [place], [place]  [address]  COLONOSCOPY PROCEDURE REPORT  PATIENT:  Ashley Cabrera, Ashley Cabrera  MR#:  [id number] BIRTHDATE:  March 27, 1934, 78 yrs. old  GENDER:  female ENDOSCOPIST:  Judie Petit T. Russella Dar, MD, [facility]  PROCEDURE DATE:  04/06/2012 PROCEDURE:  Colonoscopy with snare polypectomy ASA CLASS:  Class III INDICATIONS:  1) Iron deficiency anemia MEDICATIONS:   MAC sedation, administered by CRNA, propofol (Diprivan) 150 mg IV DESCRIPTION OF PROCEDURE:   After the risks benefits and alternatives of the procedure were thoroughly explained, informed consent was obtained.  Digital rectal exam was performed and revealed no abnormalities.   The LB CF-H180AL [id number] endoscope was introduced through the anus and advanced to the cecum, which was identified by both the appendix and ileocecal valve, without limitations.  The quality of the prep was good, using MoviPrep. The instrument was then slowly withdrawn as the colon was fully examined. <<PROCEDUREIMAGES>> FINDINGS:  A sessile polyp was found at the hepatic flexure. It was 8 mm in size. Polyp was snared without cautery. Retrieval was successful. Moderate diverticulosis was found in the sigmoid to descending colon. Otherwise normal colonoscopy without other polyps, masses, vascular ectasias, or inflammatory changes. Retroflexed views in the rectum revealed no abnormalities.  The time to cecum =  3.75  minutes. The scope was then withdrawn (time =  9.5  min) from the patient and the procedure completed.  COMPLICATIONS:  None  ENDOSCOPIC IMPRESSION: 1) 8 mm sessile polyp at the hepatic flexure 2) Moderate diverticulosis in the sigmoid to descending colon  RECOMMENDATIONS: 1) Await pathology results 2) High fiber diet with liberal fluid intake. 3) No plans for a repeat colonoscopy for polyp surveillance or colorectal cancer screening. Colonoscopy procedures for screening or surveillance usually stop around  age 66.  Venita Lick. Russella Dar, MD, [facility]  CC:  Merlene Laughter, MD  n. Rosalie DoctorVenita Lick. [place]intyre Alexa at 04/06/2012 01:50 PM  Recore, Harrie Jeans, [phone]

## 2012-04-07 ENCOUNTER — Telehealth: Payer: Self-pay | Admitting: *Deleted

## 2012-04-07 NOTE — Telephone Encounter (Signed)
  Follow up Call-  Call back number 04/06/2012  Post procedure Call Back phone  # [phone]  Permission to leave phone message Yes     Patient questions:  Do you have a fever, pain , or abdominal swelling? no Pain Score  0 *  Have you tolerated food without any problems? yes  Have you been able to return to your normal activities? yes  Do you have any questions about your discharge instructions: Diet   no Medications  no Follow up visit  no  Do you have questions or concerns about your Care? no  Actions: * If pain score is 4 or above: No action needed, pain <4.

## 2012-04-12 ENCOUNTER — Encounter: Payer: Self-pay | Admitting: Gastroenterology

## 2012-04-26 ENCOUNTER — Other Ambulatory Visit (HOSPITAL_COMMUNITY): Payer: Self-pay | Admitting: Orthopaedic Surgery

## 2012-05-05 ENCOUNTER — Encounter (HOSPITAL_COMMUNITY): Payer: Self-pay | Admitting: Pharmacy Technician

## 2012-05-16 ENCOUNTER — Encounter (HOSPITAL_COMMUNITY): Payer: Self-pay

## 2012-05-16 ENCOUNTER — Ambulatory Visit (HOSPITAL_COMMUNITY)
Admission: RE | Admit: 2012-05-16 | Discharge: 2012-05-16 | Disposition: A | Discharge status: Home / Self Care | Payer: Medicare Other | Source: Ambulatory Visit | Attending: Orthopaedic Surgery | Admitting: Orthopaedic Surgery

## 2012-05-16 ENCOUNTER — Encounter (HOSPITAL_COMMUNITY)
Admission: RE | Admit: 2012-05-16 | Discharge: 2012-05-16 | Disposition: A | Discharge status: Home / Self Care | Payer: Medicare Other | Source: Ambulatory Visit | Attending: Orthopaedic Surgery | Admitting: Orthopaedic Surgery

## 2012-05-16 DIAGNOSIS — Z01811 Encounter for preprocedural respiratory examination: Secondary | ICD-10-CM | POA: Diagnosis not present

## 2012-05-16 DIAGNOSIS — Z01818 Encounter for other preprocedural examination: Secondary | ICD-10-CM | POA: Insufficient documentation

## 2012-05-16 DIAGNOSIS — Z01812 Encounter for preprocedural laboratory examination: Secondary | ICD-10-CM | POA: Diagnosis not present

## 2012-05-16 DIAGNOSIS — R918 Other nonspecific abnormal finding of lung field: Secondary | ICD-10-CM | POA: Diagnosis not present

## 2012-05-16 DIAGNOSIS — M4120 Other idiopathic scoliosis, site unspecified: Secondary | ICD-10-CM | POA: Insufficient documentation

## 2012-05-16 HISTORY — DX: Personal history of other diseases of the digestive system: Z87.19

## 2012-05-16 HISTORY — DX: Personal history of colon polyps, unspecified: Z86.0100

## 2012-05-16 HISTORY — DX: Localized edema: R60.0

## 2012-05-16 HISTORY — DX: Allergy status to unspecified drugs, medicaments and biological substances: Z88.9

## 2012-05-16 HISTORY — DX: Personal history of colonic polyps: Z86.010

## 2012-05-16 HISTORY — DX: Effusion, unspecified joint: M25.40

## 2012-05-16 HISTORY — DX: Pain in unspecified joint: M25.50

## 2012-05-16 HISTORY — DX: Personal history of urinary (tract) infections: Z87.440

## 2012-05-16 HISTORY — DX: Dorsalgia, unspecified: M54.9

## 2012-05-16 HISTORY — DX: Gastric ulcer, unspecified as acute or chronic, without hemorrhage or perforation: K25.9

## 2012-05-16 HISTORY — DX: Edema, unspecified: R60.9

## 2012-05-16 LAB — COMPREHENSIVE METABOLIC PANEL
ALT: 18 U/L (ref 0–35)
AST: 21 U/L (ref 0–37)
Alkaline Phosphatase: 63 U/L (ref 39–117)
CO2: 28 mEq/L (ref 19–32)
Chloride: 103 mEq/L (ref 96–112)
Creatinine, Ser: 0.77 mg/dL (ref 0.50–1.10)
GFR calc non Af Amer: 80 mL/min — ABNORMAL LOW (ref >90)
Total Bilirubin: 0.5 mg/dL (ref 0.3–1.2)

## 2012-05-16 LAB — URINALYSIS, ROUTINE W REFLEX MICROSCOPIC
Bilirubin Urine: NEGATIVE
Hgb urine dipstick: NEGATIVE
Ketones, ur: NEGATIVE mg/dL
Protein, ur: NEGATIVE mg/dL
Urobilinogen, UA: 0.2 mg/dL (ref 0.0–1.0)

## 2012-05-16 LAB — CBC
MCV: 84.7 fL (ref 78.0–100.0)
Platelets: 246 10*3/uL (ref 150–400)
RBC: 4.85 MIL/uL (ref 3.87–5.11)
WBC: 6.1 10*3/uL (ref 4.0–10.5)

## 2012-05-16 LAB — URINE MICROSCOPIC-ADD ON

## 2012-05-16 LAB — TYPE AND SCREEN: ABO/RH(D): O POS

## 2012-05-16 NOTE — Progress Notes (Signed)
Pt doesn't have a cardiologist  Denies ever having a heart cath or stress test  Echo was done >19yrs and pt states it was only done as a primary,routine exam  Medical MD is Dr.Hal Corbin Ade  Denies recent ekg or cxr

## 2012-05-16 NOTE — Pre-Procedure Instructions (Signed)
20 Ermagene L Wrage  05/16/2012   Your procedure is scheduled on:  Fri, Aug 2 @ 7:30 AM  Report to [facility] at 5:30 AM.  Call this number if you have problems the morning of surgery: [phone]   Remember:   Do not eat food:After Midnight.    Take these medicines the morning of surgery with A SIP OF WATER: Diltiazem(Dilacor XR),Omeprazole(Prilosec), and Lyrica(Pregablin)   Do not wear jewelry, make-up or nail polish.  Do not wear lotions, powders, or perfumes.   Do not shave 48 hours prior to surgery.  Do not bring valuables to the hospital.  Contacts, dentures or bridgework may not be worn into surgery.  Leave suitcase in the car. After surgery it may be brought to your room.  For patients admitted to the hospital, checkout time is 11:00 AM the day of discharge.   Patients discharged the day of surgery will not be allowed to drive home.  Special Instructions: CHG Shower Use Special Wash: 1/2 bottle night before surgery and 1/2 bottle morning of surgery.   Please read over the following fact sheets that you were given: Pain Booklet, Coughing and Deep Breathing, Blood Transfusion Information, Total Joint Packet, MRSA Information and Surgical Site Infection Prevention

## 2012-05-19 DIAGNOSIS — M1711 Unilateral primary osteoarthritis, right knee: Secondary | ICD-10-CM | POA: Diagnosis present

## 2012-05-19 MED ORDER — CEFAZOLIN SODIUM-DEXTROSE 2-3 GM-% IV SOLR
2.0000 g | INTRAVENOUS | Status: AC
  Administered 2012-05-20: 2 g via INTRAVENOUS
  Filled 2012-05-19: qty 50

## 2012-05-19 NOTE — H&P (Signed)
Ashley Cabrera is an 78 y.o. female.   Chief Complaint: right knee pain HPI:  Pt with more than 2 years history of right knee pain treated conservatively with intra articular steroid injections.  Now with no relief using injections and activity modification.  Radiographs with valgus alignment and end stage osteoarthritis changes.  Pt had previous knee replacement on the left and did well after the procedure.  She wishes to proceed with right total knee arthroplasty  Past Medical History  Diagnosis Date  . MVP (mitral valve prolapse)   . Arthritis   . Peptic stricture of esophagus   . Diverticulosis   . Internal hemorrhoids   . Breast lipoma     right  . Osteopenia   . Spinal stenosis   . Peripheral neuropathy   . Allergic rhinitis   . Hypertension     takes HCTZ and Diltiazem daily  . Asthma     pt states very mild  . History of seasonal allergies   . History of bladder infections   . Peripheral neuropathy   . Peripheral edema     takes hctz daily  . Joint pain   . Joint swelling   . Back pain     d/t arthritis  . GERD (gastroesophageal reflux disease)     takes Omeprazole daily  . H/O hiatal hernia   . Gastric erosions   . Iron deficiency anemia     takes Iron pills daily  . Hx of colonic polyps     Past Surgical History  Procedure Date  . Breast excisional biopsy 1975    Benign right breast  . Irrigation and debridement sebaceous cyst   . Replacement total knee     left  . Back surgery   . Colonoscopy   . Esophagogastroduodenoscopy     Family History  Problem Relation Age of Onset  . Hypertension Father   . Lymphoma Mother   . Dementia Brother   . Hypertension Brother   . Coronary artery disease Brother   . Hypertension Sister    Social History:  reports that she has never smoked. She has never used smokeless tobacco. She reports that she does not drink alcohol or use illicit drugs.  Allergies:  Allergies  Allergen Reactions  . Vicodin  (Hydrocodone-Acetaminophen) Nausea Only    No prescriptions prior to admission    No results found for this or any previous visit (from the past 48 hour(s)). No results found.  Review of Systems  Constitutional: Negative.   HENT: Negative.   Eyes: Negative.   Respiratory: Negative.   Cardiovascular: Negative.   Gastrointestinal: Negative.   Genitourinary: Negative.   Musculoskeletal: Positive for joint pain.  Skin: Negative.   Neurological: Negative.   Endo/Heme/Allergies: Negative.   Psychiatric/Behavioral: Negative.     There were no vitals taken for this visit. Physical Exam  Constitutional: She is oriented to person, place, and time. She appears well-developed and well-nourished.  HENT:  Head: Normocephalic and atraumatic.  Eyes: EOM are normal. Pupils are equal, round, and reactive to light.  Neck: Normal range of motion. Neck supple.  Cardiovascular: Normal rate and regular rhythm.   Respiratory: Effort normal and breath sounds normal.  GI: Soft. Bowel sounds are normal.  Musculoskeletal:       Pain and crepitus with ROM of right knee. Mild edema.  No instability  Neurological: She is alert and oriented to person, place, and time.  Skin: Skin is warm and dry.  Psychiatric:  She has a normal mood and affect.   Right knee ROM 0-110 ROM,   Assessment/Plan Right knee osteoarthritis  Failed NSAIDS, intra-articular injections., Bone on Bone changes , marginal osteophytes.   PLAN: right total knee arthroplasty  VERNON,SHEILA M 05/19/2012, 3:53 PM

## 2012-05-20 ENCOUNTER — Encounter (HOSPITAL_COMMUNITY): Payer: Self-pay

## 2012-05-20 ENCOUNTER — Encounter (HOSPITAL_COMMUNITY): Payer: Self-pay | Admitting: Anesthesiology

## 2012-05-20 ENCOUNTER — Encounter (HOSPITAL_COMMUNITY)
Admission: EM | Disposition: A | Discharge status: Skilled Nursing Facility | Payer: Self-pay | Source: Ambulatory Visit | Attending: Orthopaedic Surgery

## 2012-05-20 ENCOUNTER — Inpatient Hospital Stay (HOSPITAL_COMMUNITY)
Admission: EM | Admit: 2012-05-20 | Discharge: 2012-05-23 | DRG: 470 | Disposition: A | Discharge status: Skilled Nursing Facility | Payer: Medicare Other | Source: Ambulatory Visit | Attending: Orthopaedic Surgery | Admitting: Orthopaedic Surgery

## 2012-05-20 ENCOUNTER — Inpatient Hospital Stay (HOSPITAL_COMMUNITY): Payer: Medicare Other

## 2012-05-20 ENCOUNTER — Inpatient Hospital Stay (HOSPITAL_COMMUNITY): Payer: Medicare Other | Admitting: Anesthesiology

## 2012-05-20 DIAGNOSIS — K5900 Constipation, unspecified: Secondary | ICD-10-CM | POA: Diagnosis not present

## 2012-05-20 DIAGNOSIS — I059 Rheumatic mitral valve disease, unspecified: Secondary | ICD-10-CM | POA: Diagnosis present

## 2012-05-20 DIAGNOSIS — K219 Gastro-esophageal reflux disease without esophagitis: Secondary | ICD-10-CM | POA: Diagnosis present

## 2012-05-20 DIAGNOSIS — G608 Other hereditary and idiopathic neuropathies: Secondary | ICD-10-CM | POA: Diagnosis not present

## 2012-05-20 DIAGNOSIS — M1710 Unilateral primary osteoarthritis, unspecified knee: Secondary | ICD-10-CM | POA: Diagnosis not present

## 2012-05-20 DIAGNOSIS — D649 Anemia, unspecified: Secondary | ICD-10-CM | POA: Diagnosis not present

## 2012-05-20 DIAGNOSIS — D62 Acute posthemorrhagic anemia: Secondary | ICD-10-CM | POA: Diagnosis not present

## 2012-05-20 DIAGNOSIS — M25569 Pain in unspecified knee: Secondary | ICD-10-CM | POA: Diagnosis not present

## 2012-05-20 DIAGNOSIS — M1711 Unilateral primary osteoarthritis, right knee: Secondary | ICD-10-CM | POA: Diagnosis present

## 2012-05-20 DIAGNOSIS — J45909 Unspecified asthma, uncomplicated: Secondary | ICD-10-CM | POA: Diagnosis not present

## 2012-05-20 DIAGNOSIS — Z5189 Encounter for other specified aftercare: Secondary | ICD-10-CM | POA: Diagnosis not present

## 2012-05-20 DIAGNOSIS — Z96659 Presence of unspecified artificial knee joint: Secondary | ICD-10-CM | POA: Diagnosis not present

## 2012-05-20 DIAGNOSIS — M4800 Spinal stenosis, site unspecified: Secondary | ICD-10-CM | POA: Diagnosis not present

## 2012-05-20 DIAGNOSIS — G8918 Other acute postprocedural pain: Secondary | ICD-10-CM | POA: Diagnosis not present

## 2012-05-20 DIAGNOSIS — I10 Essential (primary) hypertension: Secondary | ICD-10-CM | POA: Diagnosis not present

## 2012-05-20 DIAGNOSIS — Z471 Aftercare following joint replacement surgery: Secondary | ICD-10-CM | POA: Diagnosis not present

## 2012-05-20 DIAGNOSIS — S99919A Unspecified injury of unspecified ankle, initial encounter: Secondary | ICD-10-CM | POA: Diagnosis not present

## 2012-05-20 HISTORY — PX: KNEE ARTHROPLASTY: SHX992

## 2012-05-20 SURGERY — ARTHROPLASTY, KNEE, TOTAL, USING IMAGELESS COMPUTER-ASSISTED NAVIGATION
Anesthesia: General | Site: Knee | Laterality: Right | Wound class: Clean

## 2012-05-20 MED ORDER — LIDOCAINE HCL (CARDIAC) 20 MG/ML IV SOLN
INTRAVENOUS | Status: DC | PRN
  Administered 2012-05-20: 40 mg via INTRAVENOUS

## 2012-05-20 MED ORDER — SENNOSIDES-DOCUSATE SODIUM 8.6-50 MG PO TABS: 1.0000 | ORAL_TABLET | Freq: Every evening | ORAL | Status: DC | PRN

## 2012-05-20 MED ORDER — EPHEDRINE SULFATE 50 MG/ML IJ SOLN
INTRAMUSCULAR | Status: DC | PRN
  Administered 2012-05-20: 10 mg via INTRAVENOUS

## 2012-05-20 MED ORDER — PREGABALIN 25 MG PO CAPS
25.0000 mg | ORAL_CAPSULE | Freq: Three times a day (TID) | ORAL | Status: DC
  Administered 2012-05-20 – 2012-05-21 (×3): 25 mg via ORAL
  Filled 2012-05-20 (×3): qty 1

## 2012-05-20 MED ORDER — OXYCODONE HCL 5 MG PO TABS
ORAL_TABLET | ORAL | Status: AC
  Filled 2012-05-20: qty 2

## 2012-05-20 MED ORDER — SODIUM CHLORIDE 0.9 % IJ SOLN: 9.0000 mL | INTRAMUSCULAR | Status: DC | PRN

## 2012-05-20 MED ORDER — ACETAMINOPHEN 325 MG PO TABS
650.0000 mg | ORAL_TABLET | Freq: Four times a day (QID) | ORAL | Status: DC | PRN
  Administered 2012-05-21 – 2012-05-23 (×4): 650 mg via ORAL
  Filled 2012-05-20 (×4): qty 2

## 2012-05-20 MED ORDER — DOCUSATE SODIUM 100 MG PO CAPS
100.0000 mg | ORAL_CAPSULE | Freq: Two times a day (BID) | ORAL | Status: DC
  Administered 2012-05-20 – 2012-05-23 (×6): 100 mg via ORAL
  Filled 2012-05-20 (×7): qty 1

## 2012-05-20 MED ORDER — DIPHENHYDRAMINE HCL 12.5 MG/5ML PO ELIX: 12.5000 mg | ORAL_SOLUTION | Freq: Four times a day (QID) | ORAL | Status: DC | PRN

## 2012-05-20 MED ORDER — VITAMIN C 500 MG PO TABS
1000.0000 mg | ORAL_TABLET | Freq: Every day | ORAL | Status: DC
  Administered 2012-05-21 – 2012-05-23 (×3): 1000 mg via ORAL
  Filled 2012-05-20 (×4): qty 2

## 2012-05-20 MED ORDER — GLYCOPYRROLATE 0.2 MG/ML IJ SOLN
INTRAMUSCULAR | Status: DC | PRN
  Administered 2012-05-20: 0.4 mg via INTRAVENOUS

## 2012-05-20 MED ORDER — HYDROMORPHONE HCL PF 1 MG/ML IJ SOLN
INTRAMUSCULAR | Status: AC
  Filled 2012-05-20: qty 1

## 2012-05-20 MED ORDER — MORPHINE SULFATE (PF) 1 MG/ML IV SOLN
INTRAVENOUS | Status: AC
  Filled 2012-05-20: qty 25

## 2012-05-20 MED ORDER — METOCLOPRAMIDE HCL 5 MG/ML IJ SOLN
5.0000 mg | Freq: Three times a day (TID) | INTRAMUSCULAR | Status: DC | PRN
  Administered 2012-05-20: 10 mg via INTRAVENOUS
  Filled 2012-05-20: qty 2

## 2012-05-20 MED ORDER — METHOCARBAMOL 500 MG PO TABS
500.0000 mg | ORAL_TABLET | Freq: Four times a day (QID) | ORAL | Status: DC | PRN
  Administered 2012-05-20 – 2012-05-22 (×2): 500 mg via ORAL
  Filled 2012-05-20: qty 1

## 2012-05-20 MED ORDER — PANTOPRAZOLE SODIUM 40 MG PO TBEC
40.0000 mg | DELAYED_RELEASE_TABLET | Freq: Every day | ORAL | Status: DC
  Administered 2012-05-21 – 2012-05-23 (×3): 40 mg via ORAL
  Filled 2012-05-20 (×3): qty 1

## 2012-05-20 MED ORDER — ACETAMINOPHEN 650 MG RE SUPP: 650.0000 mg | Freq: Four times a day (QID) | RECTAL | Status: DC | PRN

## 2012-05-20 MED ORDER — SODIUM CHLORIDE 0.9 % IR SOLN
Status: DC | PRN
  Administered 2012-05-20: 3000 mL

## 2012-05-20 MED ORDER — DILTIAZEM HCL ER 240 MG PO CP24
240.0000 mg | ORAL_CAPSULE | Freq: Every day | ORAL | Status: DC
  Administered 2012-05-21 – 2012-05-23 (×3): 240 mg via ORAL
  Filled 2012-05-20 (×3): qty 1

## 2012-05-20 MED ORDER — NALOXONE HCL 0.4 MG/ML IJ SOLN
INTRAMUSCULAR | Status: AC
  Filled 2012-05-20: qty 1

## 2012-05-20 MED ORDER — HYDROMORPHONE HCL PF 1 MG/ML IJ SOLN
0.2500 mg | INTRAMUSCULAR | Status: DC | PRN
  Administered 2012-05-20 (×3): 0.5 mg via INTRAVENOUS

## 2012-05-20 MED ORDER — ONE-DAILY MULTI VITAMINS PO TABS: 1.0000 | ORAL_TABLET | Freq: Every day | ORAL | Status: DC

## 2012-05-20 MED ORDER — ASPIRIN EC 325 MG PO TBEC
325.0000 mg | DELAYED_RELEASE_TABLET | Freq: Every day | ORAL | Status: DC
  Administered 2012-05-21 – 2012-05-23 (×3): 325 mg via ORAL
  Filled 2012-05-20 (×5): qty 1

## 2012-05-20 MED ORDER — OXYCODONE HCL 5 MG PO TABS
5.0000 mg | ORAL_TABLET | ORAL | Status: DC | PRN
  Administered 2012-05-20 – 2012-05-21 (×3): 10 mg via ORAL
  Filled 2012-05-20 (×2): qty 2

## 2012-05-20 MED ORDER — HYDROCHLOROTHIAZIDE 12.5 MG PO CAPS
12.5000 mg | ORAL_CAPSULE | Freq: Every day | ORAL | Status: DC
  Administered 2012-05-21 – 2012-05-23 (×3): 12.5 mg via ORAL
  Filled 2012-05-20 (×4): qty 1

## 2012-05-20 MED ORDER — ACETAMINOPHEN 10 MG/ML IV SOLN: 1000.0000 mg | Freq: Once | INTRAVENOUS | Status: DC | PRN

## 2012-05-20 MED ORDER — FENTANYL CITRATE 0.05 MG/ML IJ SOLN
INTRAMUSCULAR | Status: DC | PRN
  Administered 2012-05-20: 50 ug via INTRAVENOUS
  Administered 2012-05-20: 100 ug via INTRAVENOUS
  Administered 2012-05-20 (×2): 50 ug via INTRAVENOUS

## 2012-05-20 MED ORDER — ONDANSETRON HCL 4 MG/2ML IJ SOLN: 4.0000 mg | Freq: Four times a day (QID) | INTRAMUSCULAR | Status: DC | PRN

## 2012-05-20 MED ORDER — ONDANSETRON HCL 4 MG/2ML IJ SOLN
4.0000 mg | Freq: Four times a day (QID) | INTRAMUSCULAR | Status: DC | PRN
  Administered 2012-05-20 (×2): 4 mg via INTRAVENOUS
  Filled 2012-05-20 (×2): qty 2

## 2012-05-20 MED ORDER — LACTATED RINGERS IV SOLN
INTRAVENOUS | Status: DC | PRN
  Administered 2012-05-20 (×2): via INTRAVENOUS

## 2012-05-20 MED ORDER — KCL IN DEXTROSE-NACL 20-5-0.45 MEQ/L-%-% IV SOLN
INTRAVENOUS | Status: DC
  Administered 2012-05-20: 1000 mL via INTRAVENOUS
  Administered 2012-05-21: 06:00:00 via INTRAVENOUS
  Filled 2012-05-20 (×4): qty 1000

## 2012-05-20 MED ORDER — ACETAMINOPHEN 10 MG/ML IV SOLN
1000.0000 mg | Freq: Four times a day (QID) | INTRAVENOUS | Status: AC
  Administered 2012-05-20 – 2012-05-21 (×4): 1000 mg via INTRAVENOUS
  Filled 2012-05-20 (×4): qty 100

## 2012-05-20 MED ORDER — METHOCARBAMOL 100 MG/ML IJ SOLN
500.0000 mg | Freq: Four times a day (QID) | INTRAMUSCULAR | Status: DC | PRN
  Administered 2012-05-21: 500 mg via INTRAVENOUS
  Filled 2012-05-20 (×3): qty 5

## 2012-05-20 MED ORDER — METHOCARBAMOL 500 MG PO TABS
ORAL_TABLET | ORAL | Status: AC
  Filled 2012-05-20: qty 1

## 2012-05-20 MED ORDER — NEOSTIGMINE METHYLSULFATE 1 MG/ML IJ SOLN
INTRAMUSCULAR | Status: DC | PRN
  Administered 2012-05-20: 3 mg via INTRAVENOUS

## 2012-05-20 MED ORDER — ADULT MULTIVITAMIN W/MINERALS CH
1.0000 | ORAL_TABLET | Freq: Every day | ORAL | Status: DC
  Administered 2012-05-21 – 2012-05-23 (×3): 1 via ORAL
  Filled 2012-05-20 (×4): qty 1

## 2012-05-20 MED ORDER — ONDANSETRON HCL 4 MG/2ML IJ SOLN: 4.0000 mg | Freq: Once | INTRAMUSCULAR | Status: DC | PRN

## 2012-05-20 MED ORDER — ZOLPIDEM TARTRATE 5 MG PO TABS: 5.0000 mg | ORAL_TABLET | Freq: Every evening | ORAL | Status: DC | PRN

## 2012-05-20 MED ORDER — MORPHINE SULFATE (PF) 1 MG/ML IV SOLN
INTRAVENOUS | Status: DC
  Administered 2012-05-20: 3 mg via INTRAVENOUS
  Administered 2012-05-21: 8 mg via INTRAVENOUS
  Administered 2012-05-21: 6 mg via INTRAVENOUS
  Administered 2012-05-21: 02:00:00 via INTRAVENOUS
  Filled 2012-05-20: qty 25

## 2012-05-20 MED ORDER — FLEET ENEMA 7-19 GM/118ML RE ENEM: 1.0000 | ENEMA | Freq: Once | RECTAL | Status: AC | PRN

## 2012-05-20 MED ORDER — METOCLOPRAMIDE HCL 10 MG PO TABS: 5.0000 mg | ORAL_TABLET | Freq: Three times a day (TID) | ORAL | Status: DC | PRN

## 2012-05-20 MED ORDER — ONDANSETRON HCL 4 MG PO TABS: 4.0000 mg | ORAL_TABLET | Freq: Four times a day (QID) | ORAL | Status: DC | PRN

## 2012-05-20 MED ORDER — NALOXONE HCL 0.4 MG/ML IJ SOLN: 0.4000 mg | INTRAMUSCULAR | Status: DC | PRN

## 2012-05-20 MED ORDER — MENTHOL 3 MG MT LOZG
1.0000 | LOZENGE | OROMUCOSAL | Status: DC | PRN
  Filled 2012-05-20: qty 9

## 2012-05-20 MED ORDER — ONDANSETRON HCL 4 MG/2ML IJ SOLN
INTRAMUSCULAR | Status: DC | PRN
  Administered 2012-05-20: 4 mg via INTRAVENOUS

## 2012-05-20 MED ORDER — PHENOL 1.4 % MT LIQD: 1.0000 | OROMUCOSAL | Status: DC | PRN

## 2012-05-20 MED ORDER — ROCURONIUM BROMIDE 100 MG/10ML IV SOLN
INTRAVENOUS | Status: DC | PRN
  Administered 2012-05-20: 40 mg via INTRAVENOUS

## 2012-05-20 MED ORDER — ACETAMINOPHEN 10 MG/ML IV SOLN
INTRAVENOUS | Status: AC
  Filled 2012-05-20: qty 100

## 2012-05-20 MED ORDER — DIPHENHYDRAMINE HCL 12.5 MG/5ML PO ELIX: 12.5000 mg | ORAL_SOLUTION | ORAL | Status: DC | PRN

## 2012-05-20 MED ORDER — DIPHENHYDRAMINE HCL 50 MG/ML IJ SOLN: 12.5000 mg | Freq: Four times a day (QID) | INTRAMUSCULAR | Status: DC | PRN

## 2012-05-20 MED ORDER — MIDAZOLAM HCL 5 MG/5ML IJ SOLN
INTRAMUSCULAR | Status: DC | PRN
  Administered 2012-05-20: 2 mg via INTRAVENOUS

## 2012-05-20 MED ORDER — ACETAMINOPHEN 10 MG/ML IV SOLN
INTRAVENOUS | Status: DC | PRN
  Administered 2012-05-20: 1000 mg via INTRAVENOUS

## 2012-05-20 MED ORDER — BISACODYL 10 MG RE SUPP: 10.0000 mg | Freq: Every day | RECTAL | Status: DC | PRN

## 2012-05-20 MED ORDER — PROPOFOL 10 MG/ML IV EMUL
INTRAVENOUS | Status: DC | PRN
  Administered 2012-05-20: 120 mg via INTRAVENOUS

## 2012-05-20 MED ORDER — CEFAZOLIN SODIUM 1-5 GM-% IV SOLN
1.0000 g | Freq: Four times a day (QID) | INTRAVENOUS | Status: AC
  Administered 2012-05-20 (×2): 1 g via INTRAVENOUS
  Filled 2012-05-20 (×2): qty 50

## 2012-05-20 MED ORDER — FERROUS SULFATE 325 (65 FE) MG PO TABS
325.0000 mg | ORAL_TABLET | Freq: Every day | ORAL | Status: DC
  Administered 2012-05-21 – 2012-05-23 (×3): 325 mg via ORAL
  Filled 2012-05-20 (×5): qty 1

## 2012-05-20 SURGICAL SUPPLY — 62 items
BANDAGE ELASTIC 4 VELCRO ST LF (GAUZE/BANDAGES/DRESSINGS) ×2 IMPLANT
BANDAGE ESMARK 6X9 LF (GAUZE/BANDAGES/DRESSINGS) ×1 IMPLANT
BENZOIN TINCTURE PRP APPL 2/3 (GAUZE/BANDAGES/DRESSINGS) ×2 IMPLANT
BLADE SAGITTAL 25.0X1.19X90 (BLADE) ×2 IMPLANT
BLADE SAW SGTL 13X75X1.27 (BLADE) ×2 IMPLANT
BNDG ELASTIC 6X10 VLCR STRL LF (GAUZE/BANDAGES/DRESSINGS) ×2 IMPLANT
BNDG ESMARK 6X9 LF (GAUZE/BANDAGES/DRESSINGS) ×2
BOWL SMART MIX CTS (DISPOSABLE) ×2 IMPLANT
CEMENT HV SMART SET (Cement) ×4 IMPLANT
CLOTH BEACON ORANGE TIMEOUT ST (SAFETY) ×2 IMPLANT
COVER BACK TABLE 24X17X13 BIG (DRAPES) ×2 IMPLANT
COVER SURGICAL LIGHT HANDLE (MISCELLANEOUS) ×2 IMPLANT
CUFF TOURNIQUET SINGLE 34IN LL (TOURNIQUET CUFF) ×2 IMPLANT
CUFF TOURNIQUET SINGLE 44IN (TOURNIQUET CUFF) IMPLANT
DRAPE ORTHO SPLIT 77X108 STRL (DRAPES) ×2
DRAPE SURG ORHT 6 SPLT 77X108 (DRAPES) ×2 IMPLANT
DRAPE U-SHAPE 47X51 STRL (DRAPES) ×2 IMPLANT
DRSG PAD ABDOMINAL 8X10 ST (GAUZE/BANDAGES/DRESSINGS) ×4 IMPLANT
DURAPREP 26ML APPLICATOR (WOUND CARE) ×2 IMPLANT
ELECT REM PT RETURN 9FT ADLT (ELECTROSURGICAL) ×2
ELECTRODE REM PT RTRN 9FT ADLT (ELECTROSURGICAL) ×1 IMPLANT
FACESHIELD LNG OPTICON STERILE (SAFETY) ×4 IMPLANT
GAUZE XEROFORM 5X9 LF (GAUZE/BANDAGES/DRESSINGS) IMPLANT
GLOVE BIOGEL PI IND STRL 7.5 (GLOVE) ×1 IMPLANT
GLOVE BIOGEL PI IND STRL 8 (GLOVE) ×1 IMPLANT
GLOVE BIOGEL PI INDICATOR 7.5 (GLOVE) ×1
GLOVE BIOGEL PI INDICATOR 8 (GLOVE) ×1
GLOVE ECLIPSE 7.0 STRL STRAW (GLOVE) ×2 IMPLANT
GLOVE ORTHO TXT STRL SZ7.5 (GLOVE) ×2 IMPLANT
GOWN PREVENTION PLUS LG XLONG (DISPOSABLE) IMPLANT
GOWN PREVENTION PLUS XLARGE (GOWN DISPOSABLE) ×2 IMPLANT
GOWN STRL NON-REIN LRG LVL3 (GOWN DISPOSABLE) ×6 IMPLANT
HANDPIECE INTERPULSE COAX TIP (DISPOSABLE) ×1
IMMOBILIZER KNEE 22 UNIV (SOFTGOODS) ×2 IMPLANT
KIT BASIN OR (CUSTOM PROCEDURE TRAY) ×2 IMPLANT
KIT ROOM TURNOVER OR (KITS) ×2 IMPLANT
MANIFOLD NEPTUNE II (INSTRUMENTS) ×2 IMPLANT
MARKER SPHERE PSV REFLC THRD 5 (MARKER) ×8 IMPLANT
NEEDLE 1/2 CIR MAYO (NEEDLE) IMPLANT
NEEDLE HYPO 25GX1X1/2 BEV (NEEDLE) IMPLANT
NS IRRIG 1000ML POUR BTL (IV SOLUTION) ×2 IMPLANT
PACK TOTAL JOINT (CUSTOM PROCEDURE TRAY) ×2 IMPLANT
PAD ARMBOARD 7.5X6 YLW CONV (MISCELLANEOUS) ×4 IMPLANT
PAD CAST 4YDX4 CTTN HI CHSV (CAST SUPPLIES) ×1 IMPLANT
PADDING CAST COTTON 4X4 STRL (CAST SUPPLIES) ×1
PADDING CAST COTTON 6X4 STRL (CAST SUPPLIES) ×2 IMPLANT
PIN SCHANZ 4MM 130MM (PIN) ×8 IMPLANT
SET HNDPC FAN SPRY TIP SCT (DISPOSABLE) ×1 IMPLANT
SPONGE GAUZE 4X4 12PLY (GAUZE/BANDAGES/DRESSINGS) ×2 IMPLANT
STAPLER VISISTAT 35W (STAPLE) IMPLANT
STRIP CLOSURE SKIN 1/2X4 (GAUZE/BANDAGES/DRESSINGS) ×4 IMPLANT
SUCTION FRAZIER TIP 10 FR DISP (SUCTIONS) ×2 IMPLANT
SUT ETHIBOND NAB CT1 #1 30IN (SUTURE) ×4 IMPLANT
SUT VIC AB 2-0 CT1 27 (SUTURE) ×2
SUT VIC AB 2-0 CT1 TAPERPNT 27 (SUTURE) ×2 IMPLANT
SUT VICRYL 4-0 PS2 18IN ABS (SUTURE) ×2 IMPLANT
SUT VICRYL AB 2 0 TIES (SUTURE) ×2 IMPLANT
SYR CONTROL 10ML LL (SYRINGE) ×2 IMPLANT
TOWEL OR 17X24 6PK STRL BLUE (TOWEL DISPOSABLE) ×2 IMPLANT
TOWEL OR 17X26 10 PK STRL BLUE (TOWEL DISPOSABLE) ×2 IMPLANT
TRAY FOLEY CATH 14FR (SET/KITS/TRAYS/PACK) ×2 IMPLANT
WATER STERILE IRR 1000ML POUR (IV SOLUTION) ×6 IMPLANT

## 2012-05-20 NOTE — Op Note (Signed)
12  Preoperative diagnosis : right knee osteoarthritis  Postoperative diagnosis : right knee osteoarthritis   procedure : right cemented total knee arthroplasty computer-assisted and Marcaine skin local.  Tourniquet time one hour and 25 minutes EBL   Surgeon : Annell Greening M.D.  Asst. Maud Deed PA-C medically necessary and present for the entire procedure  Anesthesia:  preoperative femoral nerve block, general anesthesia   Estimated blood loss; less than 100 cc  Procedure;  F. the induction of general anesthesia with preoperative femoral nerve block proximal thigh Tarka condition he'll bump lateral post proximal thigh tourniquet outpatient standard prepping draping is performed with DuraPrep usual total knee split sheets drapes Betadine Steri-Drape sterile skin marker impervious stockinette and Coban were applied.  Timeout procedure was completed Ancef was given prophylactically.  Inch incision was made superficial retinaculum was developed and the retinaculum was divided with a medial parapatellar incision. Spurs removed off the femur and off the patella.  10 mm of bone was removed off the patella and trial sizer showed that so 32 mm patella was appropriate.  Computer navigation was used and tensor placed in the femur and signed incision stab incision made in the tibia and bicortical pins are placed in the tibia. Computer models were generated for the tibia and femur. Sherron Ales was the same size as this knee and a #4 femur #3 tibia was appropriate sizes. Distal cut was made removing 9 mm of bone on the femur chamfer cuts are made. Box cut was not made into the tibia was cut. Initially 9 mm are taken off the high side. There is bone on bone changes in the knee marginalized lites in the lateral compartment showed significant wear. Lateral bone subchondral was very firm with subchondral cyst formation. Once the trial components were placed the knee was still tight in both flexion and  extension and some more bone was removed off of the tibia side taking 3 more millimeters again using the computer and this time there was identical flexion extension gaps less than half millimeter and no varus.  TR within punchball only been removed posterior spurs and then remove meniscal remnants were resected pulsatile lavage was performed while the cement was Vacumix. Tibia was cemented first followed by femur and then patella. 10 mm spacer gave excellent flexion extension Allens gave full extension and easily flex past on the 20. Once the cement was hard to 15 minutes tourniquet was deflated hemostasis obtained in in standard layer closure with nonabsorbable #1 Ethibond in the retinaculum 2-0 Vicryl subcutaneous stitch using fissure retinaculum and subcuticular 3-0 Vicryl skin closure postop dressing and knee immobilizer. Instrument count needle count was correct. Patient tolerated the procedure well transferred recovery room in stable condition.       Annell Greening M.D.

## 2012-05-20 NOTE — Preoperative (Signed)
Beta Blockers   Reason not to administer Beta Blockers:Not Applicable 

## 2012-05-20 NOTE — Anesthesia Preprocedure Evaluation (Signed)
Anesthesia Evaluation  Patient identified by MRN, date of birth, ID band Patient awake    Reviewed: Allergy & Precautions, H&P , NPO status , Patient's Chart, lab work & pertinent test results  History of Anesthesia Complications (+) PONV  Airway Mallampati: II      Dental  (+) Teeth Intact and Dental Advisory Given   Pulmonary  breath sounds clear to auscultation        Cardiovascular Rhythm:Regular Rate:Normal     Neuro/Psych    GI/Hepatic   Endo/Other    Renal/GU      Musculoskeletal   Abdominal (+) + obese,  Abdomen: soft.    Peds  Hematology   Anesthesia Other Findings   Reproductive/Obstetrics                           Anesthesia Physical Anesthesia Plan  ASA: III  Anesthesia Plan: General   Post-op Pain Management:    Induction: Intravenous  Airway Management Planned: Oral ETT  Additional Equipment:   Intra-op Plan:   Post-operative Plan: Extubation in OR  Informed Consent: I have reviewed the patients History and Physical, chart, labs and discussed the procedure including the risks, benefits and alternatives for the proposed anesthesia with the patient or authorized representative who has indicated his/her understanding and acceptance.   Dental advisory given  Plan Discussed with: CRNA and Surgeon  Anesthesia Plan Comments: (Htn GERD DJD R. Knee H/O postop Nausea and vomiting  Plan GA with FNB)        Anesthesia Quick Evaluation

## 2012-05-20 NOTE — Progress Notes (Signed)
Orthopedic Tech Progress Note Patient Details:  Ashley Cabrera 1934-04-27 [id number]  CPM Right Knee CPM Right Knee: On Right Knee Flexion (Degrees): 60  Right Knee Extension (Degrees): 0  Additional Comments: trapeze bar   Cammer, Mickie Bail 05/20/2012, 10:54 AM

## 2012-05-20 NOTE — Brief Op Note (Cosign Needed)
05/20/2012  10:07 AM  PATIENT:  Ashley Cabrera  78 y.o. female  PRE-OPERATIVE DIAGNOSIS:  Right knee osteoarthritis  POST-OPERATIVE DIAGNOSIS:  right knee osteoarthritis  PROCEDURE:  Procedure(s) (LRB): COMPUTER ASSISTED TOTAL KNEE ARTHROPLASTY (Right)  SURGEON:  Surgeon(s) and Role:    * Eldred Manges, MD - Primary  PHYSICIAN ASSISTANT: Maud Deed PAC  ASSISTANTS: none   ANESTHESIA:   general  EBL:  Total I/O In: 1400 [I.V.:1400] Out: 350 [Urine:250; Blood:100]  BLOOD ADMINISTERED:none  DRAINS: none   LOCAL MEDICATIONS USED:  NONE  SPECIMEN:  No Specimen  DISPOSITION OF SPECIMEN:  N/A  COUNTS:  YES  TOURNIQUET:   Total Tourniquet Time Documented: Thigh (Right) - 88 minutes  DICTATION: .Note written in EPIC  PLAN OF CARE: Admit to inpatient   PATIENT DISPOSITION:  PACU - hemodynamically stable.   Delay start of Pharmacological VTE agent (>24hrs) due to surgical blood loss or risk of bleeding: yes

## 2012-05-20 NOTE — Progress Notes (Signed)
Utilization review completed.  

## 2012-05-20 NOTE — Interval H&P Note (Signed)
History and Physical Interval Note:  05/20/2012 7:19 AM  Ashley Cabrera  has presented today for surgery, with the diagnosis of Right knee osteoarthritis  The various methods of treatment have been discussed with the patient and family. After consideration of risks, benefits and other options for treatment, the patient has consented to  Procedure(s) (LRB): COMPUTER ASSISTED TOTAL KNEE ARTHROPLASTY (Right) as a surgical intervention .  The patient's history has been reviewed, patient examined, no change in status, stable for surgery.  I have reviewed the patient's chart and labs.  Questions were answered to the patient's satisfaction.     Janiene Aarons C

## 2012-05-20 NOTE — Anesthesia Postprocedure Evaluation (Signed)
  Anesthesia Post-op Note  Patient: Ashley Cabrera  Procedure(s) Performed: Procedure(s) (LRB): COMPUTER ASSISTED TOTAL KNEE ARTHROPLASTY (Right)  Patient Location: PACU  Anesthesia Type: General and GA combined with regional for post-op pain  Level of Consciousness: awake, alert  and oriented  Airway and Oxygen Therapy: Patient Spontanous Breathing and Patient connected to nasal cannula oxygen  Post-op Pain: mild  Post-op Assessment: Post-op Vital signs reviewed and Patient's Cardiovascular Status Stable  Post-op Vital Signs: stable  Complications: No apparent anesthesia complications

## 2012-05-20 NOTE — Transfer of Care (Signed)
Immediate Anesthesia Transfer of Care Note  Patient: Ashley Cabrera  Procedure(s) Performed: Procedure(s) (LRB): COMPUTER ASSISTED TOTAL KNEE ARTHROPLASTY (Right)  Patient Location: PACU  Anesthesia Type: General  Level of Consciousness: awake, alert , oriented and patient cooperative  Airway & Oxygen Therapy: Patient Spontanous Breathing and Patient connected to nasal cannula oxygen  Post-op Assessment: Report given to PACU RN, Post -op Vital signs reviewed and stable and Patient moving all extremities  Post vital signs: Reviewed and stable  Complications: No apparent anesthesia complications

## 2012-05-20 NOTE — Anesthesia Procedure Notes (Addendum)
Anesthesia Regional Block:  Femoral nerve block  Pre-Anesthetic Checklist: ,, timeout performed, Correct Patient, Correct Site, Correct Laterality, Correct Procedure, Correct Position, site marked, Risks and benefits discussed,  Surgical consent,  Pre-op evaluation,  At surgeon's request and post-op pain management  Laterality: Right  Prep: chloraprep       Needles:  Injection technique: Single-shot  Needle Type: Echogenic Stimulator Needle      Needle Gauge: 22 and 22 G    Additional Needles:  Procedures: nerve stimulator Femoral nerve block Narrative:  Start time: 05/20/2012 7:15 AM End time: 05/20/2012 7:20 AM  Performed by: Personally   Additional Notes: 30 cc 0.5% Marcaine 1:200 Epi injected easily  Kipp Brood, MD   Procedure Name: Intubation Date/Time: 05/20/2012 7:43 AM Performed by: Jerilee Hoh Pre-anesthesia Checklist: Patient identified, Emergency Drugs available, Suction available and Patient being monitored Patient Re-evaluated:Patient Re-evaluated prior to inductionOxygen Delivery Method: Circle system utilized Preoxygenation: Pre-oxygenation with 100% oxygen Intubation Type: IV induction Ventilation: Mask ventilation without difficulty and Oral airway inserted - appropriate to patient size Laryngoscope Size: Mac and 3 Grade View: Grade III Tube type: Oral Tube size: 7.5 mm Number of attempts: 1 Airway Equipment and Method: Stylet Placement Confirmation: ETT inserted through vocal cords under direct vision,  positive ETCO2 and breath sounds checked- equal and bilateral Secured at: 22 cm Tube secured with: Tape Dental Injury: Injury to lip  Comments: DL x 1.  Anterior airway, Grade III view. Very small laceration to upper lip.

## 2012-05-21 LAB — CBC
MCHC: 34.4 g/dL (ref 30.0–36.0)
Platelets: 208 10*3/uL (ref 150–400)
RDW: 14.5 % (ref 11.5–15.5)
WBC: 8.2 10*3/uL (ref 4.0–10.5)

## 2012-05-21 LAB — BASIC METABOLIC PANEL
Chloride: 102 mEq/L (ref 96–112)
GFR calc Af Amer: >90 mL/min (ref >90)
GFR calc non Af Amer: 84 mL/min — ABNORMAL LOW (ref >90)
Potassium: 3.3 mEq/L — ABNORMAL LOW (ref 3.5–5.1)

## 2012-05-21 MED ORDER — HYDROMORPHONE HCL PF 1 MG/ML IJ SOLN
0.5000 mg | INTRAMUSCULAR | Status: DC | PRN
  Administered 2012-05-21: 0.5 mg via INTRAVENOUS
  Filled 2012-05-21: qty 1

## 2012-05-21 MED ORDER — PREGABALIN 50 MG PO CAPS
50.0000 mg | ORAL_CAPSULE | Freq: Three times a day (TID) | ORAL | Status: DC
  Administered 2012-05-21 – 2012-05-23 (×5): 50 mg via ORAL
  Filled 2012-05-21 (×5): qty 1

## 2012-05-21 NOTE — Evaluation (Signed)
Physical Therapy Evaluation Patient Details Name: Ashley Cabrera MRN: [id number] DOB: 1934-10-05 Today's Date: 05/21/2012 Time: 4098-1191 PT Time Calculation (min): 28 min  PT Assessment / Plan / Recommendation Clinical Impression  Pt underwent R RKA 05-20-12.  PT indicated to progress mobility, increased ROM/strength RLE, and provide education to ensure safe d/c home alone.  D/C plan is for home with HHPT.    PT Assessment  Patient needs continued PT services    Follow Up Recommendations  Home health PT    Barriers to Discharge Decreased caregiver support      Equipment Recommendations  None recommended by PT    Recommendations for Other Services     Frequency 7X/week    Precautions / Restrictions Precautions Precautions: Knee Required Braces or Orthoses: Knee Immobilizer - Right Knee Immobilizer - Right: On except when in CPM Restrictions Weight Bearing Restrictions: Yes RLE Weight Bearing: Weight bearing as tolerated   Pertinent Vitals/Pain       Mobility  Bed Mobility Bed Mobility: Supine to Sit Supine to Sit: 4: Min assist;With rails;HOB flat Details for Bed Mobility Assistance: verbal cues for sequencing Transfers Transfers: Sit to Stand;Stand to Sit;Stand Pivot Transfers Sit to Stand: 4: Min assist;From bed;With upper extremity assist Stand to Sit: 4: Min assist;To chair/3-in-1;With armrests Stand Pivot Transfers: 4: Min assist Details for Transfer Assistance: verbal cues for sequencing, hand placement Ambulation/Gait Ambulation/Gait Assistance: 4: Min assist Ambulation Distance (Feet): 5 Feet Assistive device: Rolling walker Ambulation/Gait Assistance Details: verbal cues for sequencing, RW management Gait Pattern: Step-to pattern;Decreased stance time - right;Antalgic Gait velocity: decreased    Exercises Total Joint Exercises Ankle Circles/Pumps: AROM;Both;10 reps;Supine Quad Sets: AROM;Right;5 reps Heel Slides: AAROM;Right;5 reps Goniometric ROM: R  knee AAROM 0-90 degrees in supine   PT Diagnosis: Difficulty walking;Acute pain  PT Problem List: Decreased strength;Decreased range of motion;Decreased activity tolerance;Decreased mobility;Decreased knowledge of use of DME;Decreased knowledge of precautions;Pain PT Treatment Interventions: DME instruction;Gait training;Stair training;Functional mobility training;Therapeutic activities;Therapeutic exercise;Patient/family education   PT Goals Acute Rehab PT Goals PT Goal Formulation: With patient Time For Goal Achievement: 05/28/12 Potential to Achieve Goals: Good Pt will go Supine/Side to Sit: with modified independence PT Goal: Supine/Side to Sit - Progress: Goal set today Pt will go Sit to Supine/Side: with modified independence PT Goal: Sit to Supine/Side - Progress: Goal set today Pt will go Sit to Stand: with modified independence PT Goal: Sit to Stand - Progress: Goal set today Pt will go Stand to Sit: with modified independence PT Goal: Stand to Sit - Progress: Goal set today Pt will Transfer Bed to Chair/Chair to Bed: with modified independence PT Transfer Goal: Bed to Chair/Chair to Bed - Progress: Goal set today Pt will Ambulate: >150 feet;with modified independence;with rolling walker PT Goal: Ambulate - Progress: Goal set today Pt will Go Up / Down Stairs: 1-2 stairs;with rolling walker;with min assist PT Goal: Up/Down Stairs - Progress: Goal set today Pt will Perform Home Exercise Program: with supervision, verbal cues required/provided PT Goal: Perform Home Exercise Program - Progress: Goal set today  Visit Information  Last PT Received On: 05/21/12 Assistance Needed: +1    Subjective Data  Subjective: "My mouth is so dry." Patient Stated Goal: home   Prior Functioning  Home Living Lives With: Alone Available Help at Discharge: Family;Available PRN/intermittently Type of Home: House Home Access: Stairs to enter [employer] of Steps: 2 Entrance  Stairs-Rails: None Home Layout: One level Home Adaptive Equipment: Walker - rolling;Raised toilet seat with rails  Prior Function Level of Independence: Independent Able to Take Stairs?: Yes Driving: Yes Communication Communication: No difficulties    Cognition  Overall Cognitive Status: Appears within functional limits for tasks assessed/performed Arousal/Alertness: Awake/alert Orientation Level: Appears intact for tasks assessed Behavior During Session: [facility] for tasks performed    Extremity/Trunk Assessment     Balance    End of Session PT - End of Session Equipment Utilized During Treatment: Gait belt;Right knee immobilizer Activity Tolerance: Patient limited by pain;Treatment limited secondary to medical complications (Comment) (light-headed, dizzy) Patient left: in chair;with call bell/phone within reach;with family/visitor present Nurse Communication: Mobility status CPM Right Knee CPM Right Knee: Off Right Knee Flexion (Degrees): 60  Right Knee Extension (Degrees): 0  Additional Comments: trapeze bar  GP     Ilda Foil 05/21/2012, 12:13 PM  Aida Raider, PT  Office # [phone] Pager [phone]

## 2012-05-21 NOTE — Progress Notes (Signed)
Physical Therapy Treatment Patient Details Name: Ashley Cabrera MRN: [id number] DOB: September 20, 1934 Today's Date: 05/21/2012 Time: 3086-5784 PT Time Calculation (min): 32 min  PT Assessment / Plan / Recommendation Comments on Treatment Session  Pt progressing well. Increased gait distance this PM.  Pt also reporting better pain management with oral pain meds.    Follow Up Recommendations  Home health PT    Barriers to Discharge Decreased caregiver support      Equipment Recommendations  None recommended by PT    Recommendations for Other Services    Frequency 7X/week   Plan Discharge plan remains appropriate;Frequency remains appropriate    Precautions / Restrictions Precautions Precautions: Knee Required Braces or Orthoses: Knee Immobilizer - Right Knee Immobilizer - Right: On except when in CPM Restrictions Weight Bearing Restrictions: Yes RLE Weight Bearing: Weight bearing as tolerated   Pertinent Vitals/Pain     Mobility  Bed Mobility Bed Mobility: Sit to Supine Supine to Sit: 4: Min assist;With rails;HOB flat Sit to Supine: 4: Min assist;HOB flat Details for Bed Mobility Assistance: assist with RLE Transfers Transfers: Sit to Stand;Stand to Sit;Stand Pivot Transfers Sit to Stand: 4: Min assist;From chair/3-in-1;With armrests Stand to Sit: To chair/3-in-1;To bed;With upper extremity assist;4: Min guard Stand Pivot Transfers: 4: Min assist Details for Transfer Assistance: verbal cues for sequencing, hand placement Ambulation/Gait Ambulation/Gait Assistance: 4: Min guard Ambulation Distance (Feet): 15 Feet (x 2) Assistive device: Rolling walker Ambulation/Gait Assistance Details: verbal cues for sequencing Gait Pattern: Step-to pattern;Decreased stance time - right;Antalgic Gait velocity: decreased    Exercises Total Joint Exercises Ankle Circles/Pumps: AROM;Both;10 reps;Supine Quad Sets: AROM;Right;10 reps;Supine Heel Slides: AAROM;Right;10 reps;Supine Hip  ABduction/ADduction: AAROM;Right;10 reps;Supine Goniometric ROM: R knee AAROM 0-90 degrees in supine   PT Diagnosis: Difficulty walking;Acute pain  PT Problem List: Decreased strength;Decreased range of motion;Decreased activity tolerance;Decreased mobility;Decreased knowledge of use of DME;Decreased knowledge of precautions;Pain PT Treatment Interventions: DME instruction;Gait training;Stair training;Functional mobility training;Therapeutic activities;Therapeutic exercise;Patient/family education   PT Goals Acute Rehab PT Goals PT Goal Formulation: With patient Time For Goal Achievement: 05/28/12 Potential to Achieve Goals: Good Pt will go Supine/Side to Sit: with modified independence PT Goal: Supine/Side to Sit - Progress: Goal set today Pt will go Sit to Supine/Side: with modified independence PT Goal: Sit to Supine/Side - Progress: Goal set today Pt will go Sit to Stand: with modified independence PT Goal: Sit to Stand - Progress: Goal set today Pt will go Stand to Sit: with modified independence PT Goal: Stand to Sit - Progress: Goal set today Pt will Transfer Bed to Chair/Chair to Bed: with modified independence PT Transfer Goal: Bed to Chair/Chair to Bed - Progress: Goal set today Pt will Ambulate: >150 feet;with modified independence;with rolling walker PT Goal: Ambulate - Progress: Goal set today Pt will Go Up / Down Stairs: 1-2 stairs;with rolling walker;with min assist PT Goal: Up/Down Stairs - Progress: Goal set today Pt will Perform Home Exercise Program: with supervision, verbal cues required/provided PT Goal: Perform Home Exercise Program - Progress: Goal set today  Visit Information  Last PT Received On: 05/21/12 Assistance Needed: +1    Subjective Data  Subjective: "I am doing better this afternoon." Patient Stated Goal: home   Cognition  Overall Cognitive Status: Appears within functional limits for tasks assessed/performed Arousal/Alertness:  Awake/alert Orientation Level: Appears intact for tasks assessed Behavior During Session: [facility] for tasks performed    Balance     End of Session PT - End of Session Equipment Utilized During Treatment:  Gait belt;Right knee immobilizer Activity Tolerance: Patient tolerated treatment well Patient left: in bed;with call bell/phone within reach;with family/visitor present Nurse Communication: Mobility status CPM Right Knee CPM Right Knee: On Right Knee Flexion (Degrees): 60  Right Knee Extension (Degrees): 0    GP     Ilda Foil 05/21/2012, 2:46 PM  Aida Raider, PT  Office # [phone] Pager [phone]

## 2012-05-21 NOTE — Progress Notes (Signed)
Patient requested to start taking PO pain medicine due to IV PCA Morphine making her feel "funny".  Patient had Robaxin and Oxycodone IR 10 mg ordered.  Pain management will be done using PO doses and MD will called if this is not effective. Thank you.  Joelee Snoke N

## 2012-05-21 NOTE — Progress Notes (Signed)
Patient complaining of bilateral lower extremity burning sensation.  She is currently receiving Lyrica 25mg  PO TID.  Dr. Otelia Sergeant paged.  He increased Lyrica to 50mg  PO TID.  Patient is to receive first dose at 2200.   Kristin Lamagna N

## 2012-05-21 NOTE — Progress Notes (Signed)
CSW consulted for SNF.  PT recommendation for HHPT noted.  CSW signing off as no other CSW needs identified.  Please re-consult if CSW needs arise.   Dellie Burns, MSW, Theresia Majors [phone] (Weekends 8:00am-4:30pm) After Hours for on-call Clinical Social Work:  [email or web address] Password: [email or web address]

## 2012-05-21 NOTE — Progress Notes (Signed)
Subjective: 1 Day Post-Op Procedure(s) (LRB): COMPUTER ASSISTED TOTAL KNEE ARTHROPLASTY (Right) this patient's awake alert oriented x4 family is present. She is standing full weightbearing right lower extremity. Moderate discomfort PCA has been stopped. Patient reports pain as 6 on 0-10 scale and 7 on 0-10 scale.    Objective: Vital signs in last 24 hours: Temp:  [96.3 F (35.7 C)-98 F (36.7 C)] 98 F (36.7 C) (08/03 0607) Pulse Rate:  [63-92] 92  (08/03 0607) Resp:  [14-16] 16  (08/03 0607) BP: (135-148)/(54-64) 135/54 mmHg (08/03 0607) SpO2:  [100 %] 100 % (08/03 0607)  Intake/Output from previous day: 08/02 0701 - 08/03 0700 In: 2400 [P.O.:100; I.V.:2300] Out: 595 [Urine:495; Blood:100] Intake/Output this shift:     Basename 05/21/12 0549  HGB 10.4*    Basename 05/21/12 0549  WBC 8.2  RBC 3.51*  HCT 30.2*  PLT 208    Basename 05/21/12 0549  NA 135  K 3.3*  CL 102  CO2 26  BUN 10  CREATININE 0.66  GLUCOSE 128*  CALCIUM 8.4   No results found for this basename: LABPT:2,INR:2 in the last 72 hours  Neurologically intact ABD soft Neurovascular intact Intact pulses distally Dorsiflexion/Plantar flexion intact Incision: dressing C/D/I Compartment soft  Assessment/Plan: 1 Day Post-Op Procedure(s) (LRB): COMPUTER ASSISTED TOTAL KNEE ARTHROPLASTY (Right) Advance diet Up with therapy D/C IV fluids will go ahead and discontinue PCA and maintain IV at help well.  Vieva Brummitt E 05/21/2012, 1:11 PM

## 2012-05-22 ENCOUNTER — Encounter (HOSPITAL_COMMUNITY): Payer: Self-pay | Admitting: Orthopedic Surgery

## 2012-05-22 LAB — CBC
HCT: 28.5 % — ABNORMAL LOW (ref 36.0–46.0)
Hemoglobin: 9.6 g/dL — ABNORMAL LOW (ref 12.0–15.0)
MCHC: 33.7 g/dL (ref 30.0–36.0)
RDW: 14.5 % (ref 11.5–15.5)
WBC: 8.4 10*3/uL (ref 4.0–10.5)

## 2012-05-22 NOTE — Progress Notes (Signed)
Subjective: 2 Days Post-Op Procedure(s) (LRB): COMPUTER ASSISTED TOTAL KNEE ARTHROPLASTY (Right) patient is awake alert oriented x4 in good spirits. Mild discomfort over the medial right proximal tibia and mild swelling. Pain levels are tolerable with by mouth medicines. Patient reports pain as 3 on 0-10 scale and 4 on 0-10 scale.    Objective: Vital signs in last 24 hours: Temp:  [98.5 F (36.9 C)-99.8 F (37.7 C)] 98.5 F (36.9 C) (08/04 0521) Pulse Rate:  [88-107] 93  (08/04 0521) Resp:  [16-18] 18  (08/04 0800) BP: (122-168)/(46-69) 122/52 mmHg (08/04 0521) SpO2:  [96 %-100 %] 98 % (08/04 0800)  Intake/Output from previous day: 08/03 0701 - 08/04 0700 In: 440 [P.O.:440] Out: -  Intake/Output this shift:     Basename 05/22/12 0630 05/21/12 0549  HGB 9.6* 10.4*    Basename 05/22/12 0630 05/21/12 0549  WBC 8.4 8.2  RBC 3.35* 3.51*  HCT 28.5* 30.2*  PLT 194 208    Basename 05/21/12 0549  NA 135  K 3.3*  CL 102  CO2 26  BUN 10  CREATININE 0.66  GLUCOSE 128*  CALCIUM 8.4   No results found for this basename: LABPT:2,INR:2 in the last 72 hours  Neurologically intact ABD soft Neurovascular intact Sensation intact distally Incision: scant drainage No cellulitis present Compartment soft  Assessment/Plan: 2 Days Post-Op Procedure(s) (LRB): COMPUTER ASSISTED TOTAL KNEE ARTHROPLASTY (Right)   Advance diet Up with therapy D/C IV fluids Plan for discharge tomorrow Discharge home with home health We'll discontinue hepwell.  Jhan Conery E 05/22/2012, 9:35 AM

## 2012-05-22 NOTE — Progress Notes (Signed)
Occupational Therapy Evaluation Patient Details Name: Ashley Cabrera MRN: [id number] DOB: Mar 27, 1934 Today's Date: 05/22/2012 Time: 4696-2952 OT Time Calculation (min): 36 min  OT Assessment / Plan / Recommendation Clinical Impression  78 yo s/p R TKA. Pt will benefit from skilled Ot services to max independence with ADL and functional mobility with WUX:LKGM nec AE and DME due to below deficits to facilitate D/C home with intermittent superviison of familyand friends. Pt has friend who can stay with her during the day and childern can assist as needed.     OT Assessment  Patient needs continued OT Services    Follow Up Recommendations  Home health OT    Barriers to Discharge None    Equipment Recommendations  None recommended by OT    Recommendations for Other Services    Frequency  Min 3X/week    Precautions / Restrictions Precautions Precautions: Knee Required Braces or Orthoses: Knee Immobilizer - Right Knee Immobilizer - Right: On except when in CPM Restrictions Weight Bearing Restrictions: Yes RLE Weight Bearing: Weight bearing as tolerated   Pertinent Vitals/Pain 3    ADL  Eating/Feeding: Simulated;Independent Where Assessed - Eating/Feeding: Chair Grooming: Simulated;Supervision/safety Where Assessed - Grooming: Unsupported standing Upper Body Bathing: Simulated;Modified independent Where Assessed - Upper Body Bathing: Supported sitting Lower Body Bathing: Simulated;Minimal assistance Where Assessed - Lower Body Bathing: Unsupported sit to stand Upper Body Dressing: Simulated;Set up Where Assessed - Upper Body Dressing: Supported sitting Lower Body Dressing: Simulated;Moderate assistance Where Assessed - Lower Body Dressing: Supported sit to stand Toilet Transfer: Research scientist (life sciences) Method: Sit to stand;Stand Wellsite geologist: Materials engineer and Hygiene:  Simulated;Supervision/safety Where Assessed - Engineer, mining and Hygiene: Standing Equipment Used: Knee Immobilizer;Gait belt;Rolling walker ADL Comments: no knowledge of AE    OT Diagnosis: Generalized weakness;Acute pain  OT Problem List: Decreased strength;Decreased range of motion;Decreased safety awareness;Decreased knowledge of use of DME or AE;Decreased knowledge of precautions;Pain OT Treatment Interventions: Self-care/ADL training;Energy conservation;DME and/or AE instruction;Therapeutic activities;Patient/family education   OT Goals Acute Rehab OT Goals OT Goal Formulation: With patient Time For Goal Achievement: 05/29/12 Potential to Achieve Goals: Good ADL Goals Pt Will Perform Lower Body Bathing: with supervision;with caregiver independent in assisting;Sit to stand from chair;Unsupported;with adaptive equipment;with cueing (comment type and amount) ADL Goal: Lower Body Bathing - Progress: Goal set today Pt Will Perform Lower Body Dressing: with supervision;with caregiver independent in assisting;Sit to stand from chair;Unsupported;with adaptive equipment;with cueing (comment type and amount) ADL Goal: Lower Body Dressing - Progress: Goal set today Pt Will Transfer to Toilet: with modified independence;Ambulation;with DME;3-in-1 ADL Goal: Toilet Transfer - Progress: Goal set today Pt Will Perform Toileting - Clothing Manipulation: with modified independence;Standing ADL Goal: Toileting - Clothing Manipulation - Progress: Goal set today Pt Will Perform Toileting - Hygiene: Independently;Standing at 3-in-1/toilet ADL Goal: Toileting - Hygiene - Progress: Goal set today Pt Will Perform Tub/Shower Transfer: with min assist;with caregiver independent in assisting;Transfer tub bench ADL Goal: Tub/Shower Transfer - Progress: Goal set today  Visit Information  Last OT Received On: 05/22/12 Assistance Needed: +1    Subjective Data      Prior  Functioning  Vision/Perception  Home Living Lives With: Alone Available Help at Discharge: Family;Available PRN/intermittently Type of Home: House Home Access: Stairs to enter [employer] of Steps: 2 Entrance Stairs-Rails: None Home Layout: One level Bathroom Shower/Tub: Tub/shower unit;Door [employer] Toilet: Standard Bathroom Accessibility: Yes How Accessible: Accessible via walker (sideways) Home Adaptive Equipment: Dan Humphreys -  rolling;Raised toilet seat with rails Prior Function Level of Independence: Independent Able to Take Stairs?: Yes Driving: Yes Communication Communication: No difficulties Dominant Hand: Right      Cognition  Overall Cognitive Status: Appears within functional limits for tasks assessed/performed Arousal/Alertness: Awake/alert Orientation Level: Appears intact for tasks assessed Behavior During Session: [facility] for tasks performed    Extremity/Trunk Assessment Right Upper Extremity Assessment RUE ROM/Strength/Tone: [facility] for tasks assessed Left Upper Extremity Assessment LUE ROM/Strength/Tone: Within functional levels   Mobility Bed Mobility Bed Mobility: Supine to Sit;Sit to Supine Supine to Sit: 5: Supervision;HOB flat;Other (comment) (used leg lifter) Sit to Supine: 5: Supervision;HOB flat;Other (comment) (used leg lifter) Details for Bed Mobility Assistance: vc for technique Transfers Transfers: Sit to Stand;Stand to Sit Sit to Stand: 5: Supervision;With upper extremity assist;From bed;From chair/3-in-1 Stand to Sit: 5: Supervision;With upper extremity assist;To bed;To chair/3-in-1 Details for Transfer Assistance: good technique   Exercise Total Joint Exercises Ankle Circles/Pumps: AROM;Both;10 reps;Seated Quad Sets: AROM;Right;10 reps;Seated Gluteal Sets: AROM;Both;10 reps;Seated Hip ABduction/ADduction: AAROM;Right;10 reps;Seated Knee Flexion: AROM;Right;5 reps;Seated  Balance Balance Balance Assessed:  (WFL)  End of Session  in  chair. Call bell within reach. Family present  GO     Carsen Leaf,[name] 05/22/2012, 1:32 PM [facility], OTR/L  [phone] 05/22/2012

## 2012-05-22 NOTE — Progress Notes (Signed)
Physical Therapy Treatment Patient Details Name: Ashley Cabrera MRN: [id number] DOB: [birth year] Today's Date: 05/22/2012 Time: 3086-5784 PT Time Calculation (min): 19 min  PT Assessment / Plan / Recommendation Comments on Treatment Session  Continues to progress with mobility/gait.    Follow Up Recommendations  Home health PT    Barriers to Discharge        Equipment Recommendations  None recommended by PT    Recommendations for Other Services    Frequency 7X/week   Plan Discharge plan remains appropriate;Frequency remains appropriate    Precautions / Restrictions Precautions Precautions: Knee Required Braces or Orthoses: Knee Immobilizer - Right Knee Immobilizer - Right: On except when in CPM Restrictions Weight Bearing Restrictions: Yes RLE Weight Bearing: Weight bearing as tolerated       Mobility  Transfers Transfers: Sit to Stand;Stand to Sit Sit to Stand: 4: Min assist;With upper extremity assist;With armrests;From chair/3-in-1 Stand to Sit: 4: Min guard;With upper extremity assist;With armrests;To chair/3-in-1 Details for Transfer Assistance: Verbal cues for hand placement and placement of RLE during transfers. Ambulation/Gait Ambulation/Gait Assistance: 4: Min guard Ambulation Distance (Feet): 88 Feet Assistive device: Rolling walker Ambulation/Gait Assistance Details: Verbal cues for sequencing and to look forward during gait. Gait Pattern: Step-to pattern;Decreased stance time - right;Antalgic Gait velocity: decreased    Exercises Total Joint Exercises Ankle Circles/Pumps: AROM;Both;10 reps;Seated Quad Sets: AROM;Right;10 reps;Seated Gluteal Sets: AROM;Both;10 reps;Seated Hip ABduction/ADduction: AAROM;Right;10 reps;Seated Knee Flexion: AROM;Right;5 reps;Seated    PT Goals Acute Rehab PT Goals PT Goal: Sit to Stand - Progress: Progressing toward goal PT Goal: Stand to Sit - Progress: Progressing toward goal PT Goal: Ambulate - Progress: Progressing  toward goal PT Goal: Perform Home Exercise Program - Progress: Progressing toward goal  Visit Information  Last PT Received On: 05/22/12 Assistance Needed: +1    Subjective Data  Subjective: "I'm doing well today"   Cognition  Overall Cognitive Status: Appears within functional limits for tasks assessed/performed Arousal/Alertness: Awake/alert Orientation Level: Appears intact for tasks assessed Behavior During Session: [facility] for tasks performed    Balance     End of Session PT - End of Session Equipment Utilized During Treatment: Gait belt;Right knee immobilizer Activity Tolerance: Patient tolerated treatment well Patient left: in chair;with call bell/phone within reach Nurse Communication: Mobility status   GP     Vena Austria 05/22/2012, 1:22 PM Durenda Hurt. Renaldo Fiddler, [facility] Acute Rehab Services Pager [phone]

## 2012-05-22 NOTE — Progress Notes (Signed)
Physical Therapy Note   05/22/12 1448  PT Visit Information  Last PT Received On 05/22/12  Assistance Needed +1  PT Time Calculation  PT Start Time 1416  PT Stop Time 1444  PT Time Calculation (min) 28 min  Precautions  Precautions Knee  Required Braces or Orthoses Knee Immobilizer - Right  Knee Immobilizer - Right On except when in CPM  Restrictions  Weight Bearing Restrictions Yes  RLE Weight Bearing WBAT  Cognition  Overall Cognitive Status Appears within functional limits for tasks assessed/performed  Arousal/Alertness Awake/alert  Orientation Level Appears intact for tasks assessed  Behavior During Session [facility] for tasks performed  Bed Mobility  Bed Mobility Sit to Supine  Sit to Supine 5: Supervision;HOB flat (Used leg lifter to move RLE onto bed)  Details for Bed Mobility Assistance Verbal cues for technique and proper use of leg lifter  Transfers  Transfers Sit to Stand;Stand to Sit  Sit to Stand 5: Supervision;With upper extremity assist;With armrests;From chair/3-in-1  Stand to Sit 5: Supervision;With upper extremity assist;To bed  Details for Transfer Assistance Used safe hand placement and technique  Ambulation/Gait  Ambulation/Gait Assistance 4: Min guard  Ambulation Distance (Feet) 148 Feet  Assistive device Rolling walker  Ambulation/Gait Assistance Details Used proper gait sequence.  Cues to look up during gait.  Gait Pattern Step-to pattern;Decreased stance time - right;Antalgic  Gait velocity decreased  Exercises  Exercises Total Joint  Total Joint Exercises  Ankle Circles/Pumps AROM;Both;10 reps;Supine  Quad Sets AROM;Right;10 reps;Supine  Heel Slides AAROM;Right;10 reps;Supine  Short Arc Quad AAROM;10 reps;Right;Supine  PT - End of Session  Equipment Utilized During Treatment Gait belt;Right knee immobilizer  Activity Tolerance Patient tolerated treatment well  Patient left in bed;in CPM;with call bell/phone within reach;with family/visitor  present (CPM at 0 - 65 degrees)  Nurse Communication Mobility status  PT - Assessment/Plan  Comments on Treatment Session Continues to progress with mobility.  PT Plan Discharge plan remains appropriate;Frequency remains appropriate  PT Frequency 7X/week  Follow Up Recommendations Home health PT  Equipment Recommended None recommended by PT  Acute Rehab PT Goals  PT Goal: Sit to Supine/Side - Progress Progressing toward goal  PT Goal: Sit to Stand - Progress Progressing toward goal  PT Goal: Stand to Sit - Progress Progressing toward goal  PT Goal: Ambulate - Progress Progressing toward goal  PT Goal: Perform Home Exercise Program - Progress Progressing toward goal  PT General Charges  $$ ACUTE PT VISIT 1 Procedure  PT Treatments  $Gait Training 8-22 mins  $Therapeutic Exercise 8-22 mins   Durenda Hurt. Renaldo Fiddler, [facility] Acute Rehab Services Pager [phone]

## 2012-05-23 ENCOUNTER — Encounter (HOSPITAL_COMMUNITY): Payer: Self-pay | Admitting: Orthopaedic Surgery

## 2012-05-23 DIAGNOSIS — G609 Hereditary and idiopathic neuropathy, unspecified: Secondary | ICD-10-CM | POA: Diagnosis not present

## 2012-05-23 DIAGNOSIS — K219 Gastro-esophageal reflux disease without esophagitis: Secondary | ICD-10-CM | POA: Diagnosis not present

## 2012-05-23 DIAGNOSIS — Z96659 Presence of unspecified artificial knee joint: Secondary | ICD-10-CM | POA: Diagnosis not present

## 2012-05-23 DIAGNOSIS — G608 Other hereditary and idiopathic neuropathies: Secondary | ICD-10-CM | POA: Diagnosis not present

## 2012-05-23 DIAGNOSIS — I10 Essential (primary) hypertension: Secondary | ICD-10-CM | POA: Diagnosis not present

## 2012-05-23 DIAGNOSIS — M4800 Spinal stenosis, site unspecified: Secondary | ICD-10-CM | POA: Diagnosis not present

## 2012-05-23 DIAGNOSIS — IMO0002 Reserved for concepts with insufficient information to code with codable children: Secondary | ICD-10-CM | POA: Diagnosis not present

## 2012-05-23 DIAGNOSIS — Z5189 Encounter for other specified aftercare: Secondary | ICD-10-CM | POA: Diagnosis not present

## 2012-05-23 DIAGNOSIS — M25569 Pain in unspecified knee: Secondary | ICD-10-CM | POA: Diagnosis not present

## 2012-05-23 DIAGNOSIS — M1710 Unilateral primary osteoarthritis, unspecified knee: Secondary | ICD-10-CM | POA: Diagnosis not present

## 2012-05-23 DIAGNOSIS — D649 Anemia, unspecified: Secondary | ICD-10-CM | POA: Diagnosis not present

## 2012-05-23 DIAGNOSIS — K5900 Constipation, unspecified: Secondary | ICD-10-CM | POA: Diagnosis not present

## 2012-05-23 DIAGNOSIS — D62 Acute posthemorrhagic anemia: Secondary | ICD-10-CM | POA: Diagnosis not present

## 2012-05-23 DIAGNOSIS — S8990XA Unspecified injury of unspecified lower leg, initial encounter: Secondary | ICD-10-CM | POA: Diagnosis not present

## 2012-05-23 LAB — CBC
Hemoglobin: 9.5 g/dL — ABNORMAL LOW (ref 12.0–15.0)
MCH: 29.9 pg (ref 26.0–34.0)
MCV: 85.8 fL (ref 78.0–100.0)
Platelets: 197 10*3/uL (ref 150–400)
RBC: 3.18 MIL/uL — ABNORMAL LOW (ref 3.87–5.11)
WBC: 7.2 10*3/uL (ref 4.0–10.5)

## 2012-05-23 MED ORDER — DSS 100 MG PO CAPS: 100.0000 mg | ORAL_CAPSULE | Freq: Two times a day (BID) | ORAL | Status: AC

## 2012-05-23 MED ORDER — OXYCODONE HCL 5 MG PO TABS: 5.0000 mg | ORAL_TABLET | ORAL | Status: AC | PRN

## 2012-05-23 MED ORDER — METHOCARBAMOL 500 MG PO TABS: 500.0000 mg | ORAL_TABLET | Freq: Four times a day (QID) | ORAL | Status: AC | PRN

## 2012-05-23 MED ORDER — PNEUMOCOCCAL VAC POLYVALENT 25 MCG/0.5ML IJ INJ: 0.5000 mL | INJECTION | INTRAMUSCULAR | Status: DC

## 2012-05-23 MED ORDER — ASPIRIN 325 MG PO TBEC: 325.0000 mg | DELAYED_RELEASE_TABLET | Freq: Every day | ORAL | Status: AC

## 2012-05-23 MED ORDER — BISACODYL 10 MG RE SUPP: 10.0000 mg | Freq: Every day | RECTAL | Status: AC | PRN

## 2012-05-23 MED ORDER — SENNOSIDES-DOCUSATE SODIUM 8.6-50 MG PO TABS: 1.0000 | ORAL_TABLET | Freq: Every evening | ORAL | Status: AC | PRN

## 2012-05-23 NOTE — Progress Notes (Signed)
Subjective: 3 Days Post-Op Procedure(s) (LRB): COMPUTER ASSISTED TOTAL KNEE ARTHROPLASTY (Right) Patient reports pain as moderate.   Pt and family concerned about home situation.  Apparently a friend will be available during the day,but only teenage grandchildren at night.   Pt agreeable to seek short term SNF for rehab before returning home independently. Did stairs today with PT.  Using CPM. Objective: Vital signs in last 24 hours: Temp:  [97.4 F (36.3 C)-100.9 F (38.3 C)] 98.7 F (37.1 C) (08/05 0621) Pulse Rate:  [78-94] 94  (08/05 0621) Resp:  [16-18] 16  (08/05 0621) BP: (123-151)/(50-58) 149/58 mmHg (08/05 0621) SpO2:  [96 %-99 %] 98 % (08/05 0621)  Intake/Output from previous day:   Intake/Output this shift:     Basename 05/23/12 0455 05/22/12 0630 05/21/12 0549  HGB 9.5* 9.6* 10.4*    Basename 05/23/12 0455 05/22/12 0630  WBC 7.2 8.4  RBC 3.18* 3.35*  HCT 27.3* 28.5*  PLT 197 194    Basename 05/21/12 0549  NA 135  K 3.3*  CL 102  CO2 26  BUN 10  CREATININE 0.66  GLUCOSE 128*  CALCIUM 8.4   No results found for this basename: LABPT:2,INR:2 in the last 72 hours  Neurovascular intact Sensation intact distally Intact pulses distally Dorsiflexion/Plantar flexion intact Incision: no drainage  Assessment/Plan: 3 Days Post-Op Procedure(s) (LRB): COMPUTER ASSISTED TOTAL KNEE ARTHROPLASTY (Right) Discharge to SNF if bed available today. Daughter is looking at facilities today. Continue PT/OT ASA and TEDS for DVT prophy.  Emmilee Reamer M 05/23/2012, 11:09 AM

## 2012-05-23 NOTE — Progress Notes (Signed)
CARE MANAGEMENT NOTE 05/23/2012  Patient:  Ashley Cabrera,Ashley Cabrera   Account Number:  [id number]  Date Initiated:  05/23/2012  Documentation initiated by:  Vance Peper  Subjective/Objective Assessment:   78 yr old female s/p right total knee arthroplasty     Action/Plan:   Patient lives alone will need shortterm SNF at discharge. Ashley Cabrera, Social Worker is aware. Will go to [facility].   Anticipated DC Date:  05/23/2012   Anticipated DC Plan:  SKILLED NURSING FACILITY  In-house referral  Clinical Social Worker      DC Planning Services  CM consult      [facility] Choice  NA   Choice offered to / List presented to:             Status of service:  Completed, signed off Medicare Important Message given?   (If response is "NO", the following Medicare IM given date fields will be blank) Date Medicare IM given:   Date Additional Medicare IM given:    Discharge Disposition:  SKILLED NURSING FACILITY  Per UR Regulation:    If discussed at Long Length of Stay Meetings, dates discussed:    Comments:

## 2012-05-23 NOTE — Clinical Social Work Psychosocial (Signed)
     Clinical Social Work Department BRIEF PSYCHOSOCIAL ASSESSMENT 05/23/2012  Patient:  Ashley Cabrera,Ashley Cabrera     Account Number:  [id number]     Admit date:  05/20/2012  Clinical Social Worker:  Burnard Hawthorne  Date/Time:  05/23/2012 03:28 PM  Referred by:  RN  Date Referred:  05/23/2012 Referred for  SNF Placement   Other Referral:   Interview type:  Other - See comment Other interview type:   Patient and daughter Ashley Cabrera    PSYCHOSOCIAL DATA Living Status:  ALONE Admitted from facility:   Level of care:   Primary support name:  Rogelio Seen   [phone] Primary support relationship to patient:  CHILD, ADULT Degree of support available:   Very strong support    CURRENT CONCERNS Current Concerns  Post-Acute Placement   Other Concerns:   Patient really wants to go home; daughter supports SNF for short term rehab.    SOCIAL WORK ASSESSMENT / PLAN CSW referred to see patient this a.m. after patient and daughter had expressed concerns about being able to manage at home at this time. Patient lives alone but has a Education officer, environmental during the day and would have her "grandkids" (ages 77, 69 and 27) could help stay at night. Patient admits that this may not be sufficient.  Daughter felt strongly that SNF placement would be best and patient defers to her daughter. Discussed bed process and bed search- daughter verbalized preferences and she wanted to go visit the facility of choice [facility]).  Daughter toured and signed admit papers at facility- bed is available.  OK per MD for d/c to SNF today.   Assessment/plan status:  No Further Intervention Required Other assessment/ plan:   Information/referral to community resources:   SNF bed search provided.    PATIENTS/FAMILYS RESPONSE TO PLAN OF CARE: Patient and daughter are pleased and accepting of d/c plan. Notified SNF and pt's nurse of d/c plan.

## 2012-05-23 NOTE — Progress Notes (Signed)
Called report to [facility], patient is stable, understand d/c instructions, no complaints at this time. Has progressed well and cooperating with plan.

## 2012-05-23 NOTE — Clinical Social Work Placement (Signed)
     Clinical Social Work Department CLINICAL SOCIAL WORK PLACEMENT NOTE 05/23/2012  Patient:  Ashley Cabrera,Ashley Cabrera  Account Number:  [id number] Admit date:  05/20/2012  Clinical Social Worker:  Lupita Leash Zeffie Bickert, BSW  Date/time:  05/23/2012 03:46 PM  Clinical Social Work is seeking post-discharge placement for this patient at the following level of care:   SKILLED NURSING   (*CSW will update this form in Epic as items are completed)   05/23/2012  Patient/family provided with [facility] Department of Clinical Social Works list of facilities offering this level of care within the geographic area requested by the patient (or if unable, by the patients family).  05/23/2012  Patient/family informed of their freedom to choose among providers that offer the needed level of care, that participate in Medicare, Medicaid or managed care program needed by the patient, have an available bed and are willing to accept the patient.  05/23/2012  Patient/family informed of [facility] ownership interest in [facility], as well as of the fact that they are under no obligation to receive care at this facility.  PASARR submitted to EDS on 05/20/2012 PASARR number received from EDS on 05/20/2012  FL2 transmitted to all facilities in geographic area requested by pt/family on  05/23/2012 FL2 transmitted to all facilities within larger geographic area on   Patient informed that his/her managed care company has contracts with or will negotiate with  certain facilities, including the following:   NA     Patient/family informed of bed offers received:  05/23/2012 Patient chooses bed at [facility] Physician recommends and patient chooses bed at    Patient to be transferred to [facility] on  05/23/2012 Patient to be transferred to facility by ambulance  Sharin Mons)  The following physician request were entered in Epic:   Additional Comments: Patient and daughter are very pleased with d/c plan.  Nursing and SNF are aware of d/c.

## 2012-05-23 NOTE — Progress Notes (Signed)
05/23/12 1433  PT Visit Information  Last PT Received On 05/23/12  Assistance Needed +1  PT Time Calculation  PT Start Time 0228  PT Stop Time 0302  PT Time Calculation (min) 34 min  Subjective Data  Subjective "I am going to a rehab before i go home"  Precautions  Precautions Knee  Required Braces or Orthoses Knee Immobilizer - Right  Knee Immobilizer - Right On except when in CPM  Restrictions  Weight Bearing Restrictions Yes  RLE Weight Bearing WBAT  Cognition  Overall Cognitive Status Appears within functional limits for tasks assessed/performed  Arousal/Alertness Awake/alert  Orientation Level Appears intact for tasks assessed  Behavior During Session [facility] for tasks performed  Exercises  Exercises Total Joint  Total Joint Exercises  Ankle Circles/Pumps AROM;Both;10 reps;Supine  Quad Sets AROM;Right;10 reps;Supine  Heel Slides AAROM;Right;10 reps;Supine  Hip ABduction/ADduction AAROM;Right;10 reps;Seated  Goniometric ROM 5-70  Towel Squeeze AROM;Strengthening;Both;10 reps  Short Arc Quad AAROM;10 reps;Right;Supine  Straight Leg Raises Strengthening;AROM;10 reps;Right  Long Arc Quad Strengthening;AROM;10 reps;Right  Knee Flexion AROM;Right;Seated;10 reps  PT - End of Session  Activity Tolerance Patient tolerated treatment well  Patient left in chair;with call bell/phone within reach;with family/visitor present  Nurse Communication Mobility status  PT - Assessment/Plan  Comments on Treatment Session Pt tolerated ther-ex well.   PT Plan Discharge plan remains appropriate;Frequency remains appropriate  PT Frequency 7X/week  Follow Up Recommendations Home health PT  Equipment Recommended None recommended by PT  Acute Rehab PT Goals  PT Goal Formulation With patient  Time For Goal Achievement 05/28/12  Potential to Achieve Goals Good  Pt will Perform Home Exercise Program with supervision, verbal cues required/provided  PT Goal: Perform Home Exercise Program - Progress  Met  PT General Charges  $$ ACUTE PT VISIT 1 Procedure  PT Treatments  $Therapeutic Exercise 23-37 mins   Charlotte Crumb, PT DPT  [phone]

## 2012-05-23 NOTE — Discharge Summary (Signed)
Physician Discharge Summary  Patient ID: Ashley Cabrera MRN: [id number] DOB/AGE: December 15, 1933 78 y.o.  Admit date: 05/20/2012 Discharge date: 05/23/2012  Admission Diagnoses:  Osteoarthritis of right knee  Discharge Diagnoses:  Principal Problem:  *Osteoarthritis of right knee Acute blood loss anemia : stable  Past Medical History  Diagnosis Date  . MVP (mitral valve prolapse)   . Arthritis   . Peptic stricture of esophagus   . Diverticulosis   . Internal hemorrhoids   . Breast lipoma     right  . Osteopenia   . Spinal stenosis   . Peripheral neuropathy   . Allergic rhinitis   . Hypertension     takes HCTZ and Diltiazem daily  . Asthma     pt states very mild  . History of seasonal allergies   . History of bladder infections   . Peripheral neuropathy   . Peripheral edema     takes hctz daily  . Joint pain   . Joint swelling   . Back pain     d/t arthritis  . GERD (gastroesophageal reflux disease)     takes Omeprazole daily  . H/O hiatal hernia   . Gastric erosions   . Iron deficiency anemia     takes Iron pills daily  . Hx of colonic polyps     Surgeries: Procedure(s): COMPUTER ASSISTED TOTAL KNEE ARTHROPLASTY on 05/20/2012   Consultants (if any):  none  Discharged Condition: Improved  Hospital Course: Ashley Cabrera is an 78 y.o. female who was admitted 05/20/2012 with a diagnosis of Osteoarthritis of right knee and went to the operating room on 05/20/2012 and underwent the above named procedures.    She was given perioperative antibiotics:  Anti-infectives     Start     Dose/Rate Route Frequency Ordered Stop   05/20/12 1400   ceFAZolin (ANCEF) IVPB 1 g/50 mL premix        1 g 100 mL/hr over 30 Minutes Intravenous Every 6 hours 05/20/12 1239 05/20/12 2058   05/19/12 1105   ceFAZolin (ANCEF) IVPB 2 g/50 mL premix        2 g 100 mL/hr over 30 Minutes Intravenous 60 min pre-op 05/19/12 1105 05/20/12 0746        .  She was given sequential compression  devices, early ambulation, and aspirin 325mg  daily for DVT prophylaxis.  She benefited maximally from the hospital stay and there were no complications.  Pt did not have adequate assistance at home and a short term NHP was needed.  Recent vital signs:  Filed Vitals:   05/23/12 0621  BP: 149/58  Pulse: 94  Temp: 98.7 F (37.1 C)  Resp: 16    Recent laboratory studies:  Lab Results  Component Value Date   HGB 9.5* 05/23/2012   HGB 9.6* 05/22/2012   HGB 10.4* 05/21/2012   Lab Results  Component Value Date   WBC 7.2 05/23/2012   PLT 197 05/23/2012   No results found for this basename: INR   Lab Results  Component Value Date   NA 135 05/21/2012   K 3.3* 05/21/2012   CL 102 05/21/2012   CO2 26 05/21/2012   BUN 10 05/21/2012   CREATININE 0.66 05/21/2012   GLUCOSE 128* 05/21/2012    Discharge Medications:   Medication List  As of 05/23/2012 11:31 AM   TAKE these medications         aspirin 81 MG tablet   Take 81 mg by mouth daily.  aspirin 325 MG EC tablet   Take 1 tablet (325 mg total) by mouth daily with breakfast.      bisacodyl 10 MG suppository   Commonly known as: DULCOLAX   Place 1 suppository (10 mg total) rectally daily as needed.      CALTRATE 600+D PO   Take 1 tablet by mouth daily.      diclofenac sodium 1 % Gel   Commonly known as: VOLTAREN   Apply 1 application topically every 6 (six) hours as needed. For pain      diltiazem 240 MG 24 hr capsule   Commonly known as: DILACOR XR   Take 240 mg by mouth daily.      DSS 100 MG Caps   Take 100 mg by mouth 2 (two) times daily.      ferrous sulfate 325 (65 FE) MG tablet   Take 325 mg by mouth daily with breakfast.      hydrochlorothiazide 12.5 MG capsule   Commonly known as: MICROZIDE   Take 12.5 mg by mouth daily.      methocarbamol 500 MG tablet   Commonly known as: ROBAXIN   Take 1 tablet (500 mg total) by mouth every 6 (six) hours as needed.      multivitamin tablet   Take 1 tablet by mouth daily.       OMEGA-3 KRILL OIL PO   Take 1 tablet by mouth daily.      omeprazole 20 MG capsule   Commonly known as: PRILOSEC   Take 20 mg by mouth daily.      oxyCODONE 5 MG immediate release tablet   Commonly known as: Oxy IR/ROXICODONE   Take 1-2 tablets (5-10 mg total) by mouth every 3 (three) hours as needed.      pregabalin 25 MG capsule   Commonly known as: LYRICA   Take 25 mg by mouth 3 (three) times daily.      senna-docusate 8.6-50 MG per tablet   Commonly known as: Senokot-S   Take 1 tablet by mouth at bedtime as needed for constipation.      vitamin C 1000 MG tablet   Take 1,000 mg by mouth daily.            Diagnostic Studies: Dg Chest 2 View  05/16/2012  *RADIOLOGY REPORT*  Clinical Data: Preop  CHEST - 2 VIEW  Comparison: 06/23/2008  Findings: Cardiomediastinal silhouette is stable.  No acute infiltrate or pleural effusion.  Mild thoracic dextroscoliosis. There is left basilar atelectasis or scarring.  No pulmonary edema. Osteopenia and mild degenerative changes thoracic spine.  IMPRESSION: No acute infiltrate or pulmonary edema.  Left basilar atelectasis or scarring.  Original Report Authenticated By: Natasha Mead, M.D.   X-ray Knee Right Port  05/20/2012  *RADIOLOGY REPORT*  Clinical Data: Postop arthroplasty  PORTABLE RIGHT KNEE - 1-2 VIEW  Comparison: None.  Findings: Total knee arthroplasty is in place.  Anatomic alignment of the osseous and prosthetic structures.  No breakage or loosening of the hardware.  Gas is present in the soft tissues compatible with recent surgery.  IMPRESSION: Total knee arthroplasty anatomically aligned.  Original Report Authenticated By: Donavan Burnet, M.D.    Disposition:   Discharge Orders    Future Orders Please Complete By Expires   Diet - low sodium heart healthy      Call MD / Call 911      Comments:   If you experience chest pain or shortness of breath, CALL 911  and be transported to the hospital emergency room.  If you develope a fever  above 101 F, pus (white drainage) or increased drainage or redness at the wound, or calf pain, call your surgeon's office.   Constipation Prevention      Comments:   Drink plenty of fluids.  Prune juice may be helpful.  You may use a stool softener, such as Colace (over the counter) 100 mg twice a day.  Use MiraLax (over the counter) for constipation as needed.   Increase activity slowly as tolerated      Discharge instructions      Comments:   Change dressing daily or as needed.  Walk as tolerated.  Weight bear as tolerated.  Range of motion exercises daily.  Knee immobilizer for walking only.   TED hose      Comments:   Use stockings (TED hose) for 4 weeks on both leg(s).  You may remove them at night for sleeping.   Do not put a pillow under the knee. Place it under the heel.      Place TED hose      Scheduling Instructions:   Bilateral knee hi TEDS for DVT prophylaxis   CPM      Comments:   Continuous passive motion machine (CPM):      Use the CPM from 40 to 90 for 8 hours per day in divided times      You may increase by 10degrees per day.  You may break it up into 2 or 3 sessions per day.      Use CPM for 2 weeks or until you are told to stop. Use only at facility.  Do not need for home use.      Follow-up Information    Follow up with Eldred Manges, MD. Schedule an appointment as soon as possible for a visit in 2 weeks.   Contact information:   [facility] [address] [place] [address] [phone]           Signed: Wende Neighbors 05/23/2012, 11:31 AM

## 2012-05-23 NOTE — Progress Notes (Signed)
Physical Therapy Treatment Patient Details Name: Ashley Cabrera MRN: [id number] DOB: 1934-10-04 Today's Date: 05/23/2012 Time: 4098-1191 PT Time Calculation (min): 28 min  PT Assessment / Plan / Recommendation Comments on Treatment Session  Pt able to safely negotiate stairs with rw.  Pt continues to make steady progress. Rec patient d/c home with home PT..    Follow Up Recommendations  Home health PT    Barriers to Discharge        Equipment Recommendations  None recommended by PT    Recommendations for Other Services    Frequency 7X/week   Plan Discharge plan remains appropriate;Frequency remains appropriate    Precautions / Restrictions Precautions Precautions: Knee Required Braces or Orthoses: Knee Immobilizer - Right Knee Immobilizer - Right: On except when in CPM Restrictions Weight Bearing Restrictions: Yes RLE Weight Bearing: Weight bearing as tolerated   Pertinent Vitals/Pain 5/10     Mobility  Bed Mobility Bed Mobility: Sit to Supine Supine to Sit: 5: Supervision;HOB flat;Other (comment) (using assistive device) Transfers Transfers: Sit to Stand;Stand to Sit Sit to Stand: 6: Modified independent (Device/Increase time);With upper extremity assist;From chair/3-in-1 Stand to Sit: 6: Modified independent (Device/Increase time);To toilet;With upper extremity assist Details for Transfer Assistance: demonstrates safe technique Ambulation/Gait Ambulation/Gait Assistance: 5: Supervision Ambulation Distance (Feet): 150 Feet Assistive device: Rolling walker Ambulation/Gait Assistance Details: Proper sequencing with VCs for step through gait and upright posture Gait Pattern: Step-to pattern;Decreased stance time - right;Antalgic;Step-through pattern Gait velocity: decreased Stairs: Yes Stairs Assistance: 4: Min assist Stairs Assistance Details (indicate cue type and reason): Min assist for backwards method Stair Management Technique: Backwards;With walker Number of  Stairs: 4     Exercises     PT Diagnosis:    PT Problem List:   PT Treatment Interventions:     PT Goals Acute Rehab PT Goals PT Goal Formulation: With patient Time For Goal Achievement: 05/28/12 Potential to Achieve Goals: Good Pt will go Supine/Side to Sit: with modified independence PT Goal: Supine/Side to Sit - Progress: Met Pt will go Sit to Supine/Side: with modified independence PT Goal: Sit to Supine/Side - Progress: Progressing toward goal Pt will go Sit to Stand: with modified independence PT Goal: Sit to Stand - Progress: Progressing toward goal Pt will go Stand to Sit: with modified independence PT Goal: Stand to Sit - Progress: Progressing toward goal Pt will Ambulate: >150 feet;with modified independence;with rolling walker PT Goal: Ambulate - Progress: Progressing toward goal Pt will Go Up / Down Stairs: 1-2 stairs;with rolling walker;with min assist PT Goal: Up/Down Stairs - Progress: Met  Visit Information  Last PT Received On: 05/23/12 Assistance Needed: +1    Subjective Data  Subjective: "I'm feeling pretty good just sitting here"   Cognition  Overall Cognitive Status: Appears within functional limits for tasks assessed/performed Arousal/Alertness: Awake/alert Orientation Level: Appears intact for tasks assessed Behavior During Session: [facility] for tasks performed    Balance     End of Session PT - End of Session Equipment Utilized During Treatment: Gait belt;Right knee immobilizer Activity Tolerance: Patient tolerated treatment well Patient left: in bed;in CPM;with call bell/phone within reach;with family/visitor present Nurse Communication: Mobility status CPM Right Knee CPM Right Knee: Off   GP     Fabio Asa 05/23/2012, 1:12 PM Charlotte Crumb, PT DPT  [phone]

## 2012-05-23 NOTE — Progress Notes (Signed)
Occupational Therapy Treatment Patient Details Name: Ashley Cabrera MRN: [id number] DOB: [birth year] Today's Date: 05/23/2012 Time: 9811-9147 OT Time Calculation (min): 23 min  OT Assessment / Plan / Recommendation Comments on Treatment Session Excellent progress. Family chooses to go to SNF. will continue OT at SNF.    Follow Up Recommendations  Other (comment) (family chooses SNF)    Barriers to Discharge       Equipment Recommendations  Defer to next venue    Recommendations for Other Services    Frequency Min 2X/week   Plan Discharge plan needs to be updated    Precautions / Restrictions Precautions Precautions: Knee Required Braces or Orthoses: Knee Immobilizer - Right Restrictions Weight Bearing Restrictions: Yes RLE Weight Bearing: Weight bearing as tolerated   Pertinent Vitals/Pain 3    ADL  Toilet Transfer: Performed;Modified independent Toilet Transfer Method: Sit to Barista: Materials engineer and Hygiene: Performed;Modified independent Where Assessed - Toileting Clothing Manipulation and Hygiene: Standing ADL Comments: using leg lifter for bed mobility. Making excellent progress    OT Diagnosis:    OT Problem List:   OT Treatment Interventions:     OT Goals Acute Rehab OT Goals OT Goal Formulation: With patient Time For Goal Achievement: 05/29/12 Potential to Achieve Goals: Good ADL Goals Pt Will Perform Lower Body Bathing: with supervision;with caregiver independent in assisting;Sit to stand from chair;Unsupported;with adaptive equipment;with cueing (comment type and amount) ADL Goal: Lower Body Bathing - Progress: Progressing toward goals Pt Will Perform Lower Body Dressing: with supervision;with caregiver independent in assisting;Sit to stand from chair;Unsupported;with adaptive equipment;with cueing (comment type and amount) ADL Goal: Lower Body Dressing - Progress: Progressing toward goals Pt  Will Transfer to Toilet: with modified independence;Ambulation;with DME;3-in-1 ADL Goal: Toilet Transfer - Progress: Met Pt Will Perform Toileting - Clothing Manipulation: with modified independence;Standing ADL Goal: Toileting - Clothing Manipulation - Progress: Met Pt Will Perform Toileting - Hygiene: Independently;Standing at 3-in-1/toilet ADL Goal: Toileting - Hygiene - Progress: Met  Visit Information  Last OT Received On: 05/23/12 Assistance Needed: +1    Subjective Data      Prior Functioning       Cognition       Mobility Transfers Transfers: Sit to Stand;Stand to Sit Sit to Stand: 6: Modified independent (Device/Increase time);With upper extremity assist;From chair/3-in-1 Stand to Sit: 6: Modified independent (Device/Increase time);To toilet;With upper extremity assist   Exercises    Balance    End of Session OT - End of Session Equipment Utilized During Treatment: Right knee immobilizer Activity Tolerance: Patient tolerated treatment well Patient left: Other (comment) (in bathroom) Nurse Communication: Other (comment) (pt in bathroom) CPM Right Knee CPM Right Knee: Off Right Knee Flexion (Degrees): 70  Right Knee Extension (Degrees): 0   GO     Analisa Sledd,[name] 05/23/2012, 12:33 PM [facility], OTR/L  [phone] 05/23/2012

## 2012-05-24 DIAGNOSIS — I10 Essential (primary) hypertension: Secondary | ICD-10-CM | POA: Diagnosis not present

## 2012-05-24 DIAGNOSIS — G609 Hereditary and idiopathic neuropathy, unspecified: Secondary | ICD-10-CM | POA: Diagnosis not present

## 2012-05-24 DIAGNOSIS — D62 Acute posthemorrhagic anemia: Secondary | ICD-10-CM | POA: Diagnosis not present

## 2012-05-30 DIAGNOSIS — K219 Gastro-esophageal reflux disease without esophagitis: Secondary | ICD-10-CM | POA: Diagnosis not present

## 2012-05-30 DIAGNOSIS — I10 Essential (primary) hypertension: Secondary | ICD-10-CM | POA: Diagnosis not present

## 2012-05-30 DIAGNOSIS — G608 Other hereditary and idiopathic neuropathies: Secondary | ICD-10-CM | POA: Diagnosis not present

## 2012-05-30 DIAGNOSIS — M1710 Unilateral primary osteoarthritis, unspecified knee: Secondary | ICD-10-CM | POA: Diagnosis not present

## 2012-06-01 DIAGNOSIS — R269 Unspecified abnormalities of gait and mobility: Secondary | ICD-10-CM | POA: Diagnosis not present

## 2012-06-01 DIAGNOSIS — Z96659 Presence of unspecified artificial knee joint: Secondary | ICD-10-CM | POA: Diagnosis not present

## 2012-06-01 DIAGNOSIS — IMO0001 Reserved for inherently not codable concepts without codable children: Secondary | ICD-10-CM | POA: Diagnosis not present

## 2012-06-01 DIAGNOSIS — I10 Essential (primary) hypertension: Secondary | ICD-10-CM | POA: Diagnosis not present

## 2012-06-01 DIAGNOSIS — Z471 Aftercare following joint replacement surgery: Secondary | ICD-10-CM | POA: Diagnosis not present

## 2012-06-01 DIAGNOSIS — G609 Hereditary and idiopathic neuropathy, unspecified: Secondary | ICD-10-CM | POA: Diagnosis not present

## 2012-06-02 DIAGNOSIS — I10 Essential (primary) hypertension: Secondary | ICD-10-CM | POA: Diagnosis not present

## 2012-06-02 DIAGNOSIS — R269 Unspecified abnormalities of gait and mobility: Secondary | ICD-10-CM | POA: Diagnosis not present

## 2012-06-02 DIAGNOSIS — G609 Hereditary and idiopathic neuropathy, unspecified: Secondary | ICD-10-CM | POA: Diagnosis not present

## 2012-06-02 DIAGNOSIS — IMO0001 Reserved for inherently not codable concepts without codable children: Secondary | ICD-10-CM | POA: Diagnosis not present

## 2012-06-02 DIAGNOSIS — Z96659 Presence of unspecified artificial knee joint: Secondary | ICD-10-CM | POA: Diagnosis not present

## 2012-06-02 DIAGNOSIS — Z471 Aftercare following joint replacement surgery: Secondary | ICD-10-CM | POA: Diagnosis not present

## 2012-06-03 DIAGNOSIS — Z96659 Presence of unspecified artificial knee joint: Secondary | ICD-10-CM | POA: Diagnosis not present

## 2012-06-03 DIAGNOSIS — R269 Unspecified abnormalities of gait and mobility: Secondary | ICD-10-CM | POA: Diagnosis not present

## 2012-06-03 DIAGNOSIS — Z471 Aftercare following joint replacement surgery: Secondary | ICD-10-CM | POA: Diagnosis not present

## 2012-06-03 DIAGNOSIS — G609 Hereditary and idiopathic neuropathy, unspecified: Secondary | ICD-10-CM | POA: Diagnosis not present

## 2012-06-03 DIAGNOSIS — I10 Essential (primary) hypertension: Secondary | ICD-10-CM | POA: Diagnosis not present

## 2012-06-03 DIAGNOSIS — IMO0001 Reserved for inherently not codable concepts without codable children: Secondary | ICD-10-CM | POA: Diagnosis not present

## 2012-06-06 DIAGNOSIS — Z471 Aftercare following joint replacement surgery: Secondary | ICD-10-CM | POA: Diagnosis not present

## 2012-06-06 DIAGNOSIS — I10 Essential (primary) hypertension: Secondary | ICD-10-CM | POA: Diagnosis not present

## 2012-06-06 DIAGNOSIS — G609 Hereditary and idiopathic neuropathy, unspecified: Secondary | ICD-10-CM | POA: Diagnosis not present

## 2012-06-06 DIAGNOSIS — IMO0001 Reserved for inherently not codable concepts without codable children: Secondary | ICD-10-CM | POA: Diagnosis not present

## 2012-06-06 DIAGNOSIS — R269 Unspecified abnormalities of gait and mobility: Secondary | ICD-10-CM | POA: Diagnosis not present

## 2012-06-06 DIAGNOSIS — Z96659 Presence of unspecified artificial knee joint: Secondary | ICD-10-CM | POA: Diagnosis not present

## 2012-06-07 DIAGNOSIS — M1710 Unilateral primary osteoarthritis, unspecified knee: Secondary | ICD-10-CM | POA: Diagnosis not present

## 2012-06-08 DIAGNOSIS — Z471 Aftercare following joint replacement surgery: Secondary | ICD-10-CM | POA: Diagnosis not present

## 2012-06-08 DIAGNOSIS — IMO0001 Reserved for inherently not codable concepts without codable children: Secondary | ICD-10-CM | POA: Diagnosis not present

## 2012-06-08 DIAGNOSIS — I10 Essential (primary) hypertension: Secondary | ICD-10-CM | POA: Diagnosis not present

## 2012-06-08 DIAGNOSIS — G609 Hereditary and idiopathic neuropathy, unspecified: Secondary | ICD-10-CM | POA: Diagnosis not present

## 2012-06-08 DIAGNOSIS — R269 Unspecified abnormalities of gait and mobility: Secondary | ICD-10-CM | POA: Diagnosis not present

## 2012-06-08 DIAGNOSIS — Z96659 Presence of unspecified artificial knee joint: Secondary | ICD-10-CM | POA: Diagnosis not present

## 2012-06-10 DIAGNOSIS — I10 Essential (primary) hypertension: Secondary | ICD-10-CM | POA: Diagnosis not present

## 2012-06-10 DIAGNOSIS — Z471 Aftercare following joint replacement surgery: Secondary | ICD-10-CM | POA: Diagnosis not present

## 2012-06-10 DIAGNOSIS — G609 Hereditary and idiopathic neuropathy, unspecified: Secondary | ICD-10-CM | POA: Diagnosis not present

## 2012-06-10 DIAGNOSIS — R269 Unspecified abnormalities of gait and mobility: Secondary | ICD-10-CM | POA: Diagnosis not present

## 2012-06-10 DIAGNOSIS — IMO0001 Reserved for inherently not codable concepts without codable children: Secondary | ICD-10-CM | POA: Diagnosis not present

## 2012-06-10 DIAGNOSIS — Z96659 Presence of unspecified artificial knee joint: Secondary | ICD-10-CM | POA: Diagnosis not present

## 2012-06-13 DIAGNOSIS — M1710 Unilateral primary osteoarthritis, unspecified knee: Secondary | ICD-10-CM | POA: Diagnosis not present

## 2012-06-13 DIAGNOSIS — Z96659 Presence of unspecified artificial knee joint: Secondary | ICD-10-CM | POA: Diagnosis not present

## 2012-06-13 DIAGNOSIS — M25569 Pain in unspecified knee: Secondary | ICD-10-CM | POA: Diagnosis not present

## 2012-06-16 DIAGNOSIS — Z96659 Presence of unspecified artificial knee joint: Secondary | ICD-10-CM | POA: Diagnosis not present

## 2012-06-16 DIAGNOSIS — M25569 Pain in unspecified knee: Secondary | ICD-10-CM | POA: Diagnosis not present

## 2012-06-16 DIAGNOSIS — M1710 Unilateral primary osteoarthritis, unspecified knee: Secondary | ICD-10-CM | POA: Diagnosis not present

## 2012-06-21 DIAGNOSIS — M25569 Pain in unspecified knee: Secondary | ICD-10-CM | POA: Diagnosis not present

## 2012-06-21 DIAGNOSIS — M1710 Unilateral primary osteoarthritis, unspecified knee: Secondary | ICD-10-CM | POA: Diagnosis not present

## 2012-06-21 DIAGNOSIS — Z96659 Presence of unspecified artificial knee joint: Secondary | ICD-10-CM | POA: Diagnosis not present

## 2012-06-23 DIAGNOSIS — Z96659 Presence of unspecified artificial knee joint: Secondary | ICD-10-CM | POA: Diagnosis not present

## 2012-06-23 DIAGNOSIS — M25569 Pain in unspecified knee: Secondary | ICD-10-CM | POA: Diagnosis not present

## 2012-06-23 DIAGNOSIS — M1710 Unilateral primary osteoarthritis, unspecified knee: Secondary | ICD-10-CM | POA: Diagnosis not present

## 2012-06-28 DIAGNOSIS — M25569 Pain in unspecified knee: Secondary | ICD-10-CM | POA: Diagnosis not present

## 2012-06-28 DIAGNOSIS — M1710 Unilateral primary osteoarthritis, unspecified knee: Secondary | ICD-10-CM | POA: Diagnosis not present

## 2012-06-28 DIAGNOSIS — Z96659 Presence of unspecified artificial knee joint: Secondary | ICD-10-CM | POA: Diagnosis not present

## 2012-06-30 DIAGNOSIS — M1710 Unilateral primary osteoarthritis, unspecified knee: Secondary | ICD-10-CM | POA: Diagnosis not present

## 2012-06-30 DIAGNOSIS — M25569 Pain in unspecified knee: Secondary | ICD-10-CM | POA: Diagnosis not present

## 2012-06-30 DIAGNOSIS — Z96659 Presence of unspecified artificial knee joint: Secondary | ICD-10-CM | POA: Diagnosis not present

## 2012-07-05 DIAGNOSIS — M1710 Unilateral primary osteoarthritis, unspecified knee: Secondary | ICD-10-CM | POA: Diagnosis not present

## 2012-07-05 DIAGNOSIS — M25569 Pain in unspecified knee: Secondary | ICD-10-CM | POA: Diagnosis not present

## 2012-07-05 DIAGNOSIS — Z96659 Presence of unspecified artificial knee joint: Secondary | ICD-10-CM | POA: Diagnosis not present

## 2012-07-07 DIAGNOSIS — M1710 Unilateral primary osteoarthritis, unspecified knee: Secondary | ICD-10-CM | POA: Diagnosis not present

## 2012-07-07 DIAGNOSIS — Z96659 Presence of unspecified artificial knee joint: Secondary | ICD-10-CM | POA: Diagnosis not present

## 2012-07-07 DIAGNOSIS — M25569 Pain in unspecified knee: Secondary | ICD-10-CM | POA: Diagnosis not present

## 2012-07-12 DIAGNOSIS — Z96659 Presence of unspecified artificial knee joint: Secondary | ICD-10-CM | POA: Diagnosis not present

## 2012-07-12 DIAGNOSIS — M1710 Unilateral primary osteoarthritis, unspecified knee: Secondary | ICD-10-CM | POA: Diagnosis not present

## 2012-07-12 DIAGNOSIS — M25569 Pain in unspecified knee: Secondary | ICD-10-CM | POA: Diagnosis not present

## 2012-07-14 DIAGNOSIS — M25569 Pain in unspecified knee: Secondary | ICD-10-CM | POA: Diagnosis not present

## 2012-07-14 DIAGNOSIS — M1710 Unilateral primary osteoarthritis, unspecified knee: Secondary | ICD-10-CM | POA: Diagnosis not present

## 2012-07-14 DIAGNOSIS — Z96659 Presence of unspecified artificial knee joint: Secondary | ICD-10-CM | POA: Diagnosis not present

## 2012-07-19 DIAGNOSIS — M25569 Pain in unspecified knee: Secondary | ICD-10-CM | POA: Diagnosis not present

## 2012-07-19 DIAGNOSIS — M545 Low back pain: Secondary | ICD-10-CM | POA: Diagnosis not present

## 2012-07-19 DIAGNOSIS — M5126 Other intervertebral disc displacement, lumbar region: Secondary | ICD-10-CM | POA: Diagnosis not present

## 2012-07-19 DIAGNOSIS — M1710 Unilateral primary osteoarthritis, unspecified knee: Secondary | ICD-10-CM | POA: Diagnosis not present

## 2012-08-04 ENCOUNTER — Other Ambulatory Visit: Payer: Self-pay | Admitting: Geriatric Medicine

## 2012-08-04 DIAGNOSIS — Z1231 Encounter for screening mammogram for malignant neoplasm of breast: Secondary | ICD-10-CM

## 2012-08-24 DIAGNOSIS — Z23 Encounter for immunization: Secondary | ICD-10-CM | POA: Diagnosis not present

## 2012-08-24 DIAGNOSIS — Z79899 Other long term (current) drug therapy: Secondary | ICD-10-CM | POA: Diagnosis not present

## 2012-08-24 DIAGNOSIS — M549 Dorsalgia, unspecified: Secondary | ICD-10-CM | POA: Diagnosis not present

## 2012-08-24 DIAGNOSIS — G609 Hereditary and idiopathic neuropathy, unspecified: Secondary | ICD-10-CM | POA: Diagnosis not present

## 2012-08-24 DIAGNOSIS — I10 Essential (primary) hypertension: Secondary | ICD-10-CM | POA: Diagnosis not present

## 2012-08-24 DIAGNOSIS — D509 Iron deficiency anemia, unspecified: Secondary | ICD-10-CM | POA: Diagnosis not present

## 2012-09-06 ENCOUNTER — Ambulatory Visit
Admission: RE | Admit: 2012-09-06 | Discharge: 2012-09-06 | Disposition: A | Discharge status: Home / Self Care | Payer: Medicare Other | Source: Ambulatory Visit | Attending: Geriatric Medicine | Admitting: Geriatric Medicine

## 2012-09-06 DIAGNOSIS — Z1231 Encounter for screening mammogram for malignant neoplasm of breast: Secondary | ICD-10-CM

## 2012-10-26 DIAGNOSIS — R21 Rash and other nonspecific skin eruption: Secondary | ICD-10-CM | POA: Diagnosis not present

## 2013-01-04 DIAGNOSIS — J069 Acute upper respiratory infection, unspecified: Secondary | ICD-10-CM | POA: Diagnosis not present

## 2013-01-04 DIAGNOSIS — J309 Allergic rhinitis, unspecified: Secondary | ICD-10-CM | POA: Diagnosis not present

## 2013-01-04 DIAGNOSIS — R05 Cough: Secondary | ICD-10-CM | POA: Diagnosis not present

## 2013-02-20 DIAGNOSIS — J309 Allergic rhinitis, unspecified: Secondary | ICD-10-CM | POA: Diagnosis not present

## 2013-02-20 DIAGNOSIS — M545 Low back pain: Secondary | ICD-10-CM | POA: Diagnosis not present

## 2013-03-24 DIAGNOSIS — Z79899 Other long term (current) drug therapy: Secondary | ICD-10-CM | POA: Diagnosis not present

## 2013-03-24 DIAGNOSIS — Z0000 Encounter for general adult medical examination without abnormal findings: Secondary | ICD-10-CM | POA: Diagnosis not present

## 2013-03-24 DIAGNOSIS — I10 Essential (primary) hypertension: Secondary | ICD-10-CM | POA: Diagnosis not present

## 2013-03-24 DIAGNOSIS — E782 Mixed hyperlipidemia: Secondary | ICD-10-CM | POA: Diagnosis not present

## 2013-03-24 DIAGNOSIS — Z1331 Encounter for screening for depression: Secondary | ICD-10-CM | POA: Diagnosis not present

## 2013-04-19 DIAGNOSIS — H35039 Hypertensive retinopathy, unspecified eye: Secondary | ICD-10-CM | POA: Diagnosis not present

## 2013-04-19 DIAGNOSIS — H43399 Other vitreous opacities, unspecified eye: Secondary | ICD-10-CM | POA: Diagnosis not present

## 2013-04-19 DIAGNOSIS — H2510 Age-related nuclear cataract, unspecified eye: Secondary | ICD-10-CM | POA: Diagnosis not present

## 2013-04-19 DIAGNOSIS — H02839 Dermatochalasis of unspecified eye, unspecified eyelid: Secondary | ICD-10-CM | POA: Diagnosis not present

## 2013-05-16 DIAGNOSIS — H2510 Age-related nuclear cataract, unspecified eye: Secondary | ICD-10-CM | POA: Diagnosis not present

## 2013-05-30 DIAGNOSIS — H269 Unspecified cataract: Secondary | ICD-10-CM | POA: Diagnosis not present

## 2013-05-30 DIAGNOSIS — H2510 Age-related nuclear cataract, unspecified eye: Secondary | ICD-10-CM | POA: Diagnosis not present

## 2013-07-06 DIAGNOSIS — H2510 Age-related nuclear cataract, unspecified eye: Secondary | ICD-10-CM | POA: Diagnosis not present

## 2013-07-18 DIAGNOSIS — H269 Unspecified cataract: Secondary | ICD-10-CM | POA: Diagnosis not present

## 2013-07-18 DIAGNOSIS — H2510 Age-related nuclear cataract, unspecified eye: Secondary | ICD-10-CM | POA: Diagnosis not present

## 2013-08-11 ENCOUNTER — Other Ambulatory Visit: Payer: Self-pay

## 2013-08-11 DIAGNOSIS — Z1231 Encounter for screening mammogram for malignant neoplasm of breast: Secondary | ICD-10-CM

## 2013-09-08 ENCOUNTER — Ambulatory Visit
Admission: RE | Admit: 2013-09-08 | Discharge: 2013-09-08 | Disposition: A | Discharge status: Home / Self Care | Payer: Medicare Other | Source: Ambulatory Visit

## 2013-09-08 DIAGNOSIS — Z1231 Encounter for screening mammogram for malignant neoplasm of breast: Secondary | ICD-10-CM | POA: Diagnosis not present

## 2013-09-21 DIAGNOSIS — I10 Essential (primary) hypertension: Secondary | ICD-10-CM | POA: Diagnosis not present

## 2014-03-26 DIAGNOSIS — Z23 Encounter for immunization: Secondary | ICD-10-CM | POA: Diagnosis not present

## 2014-03-26 DIAGNOSIS — Z79899 Other long term (current) drug therapy: Secondary | ICD-10-CM | POA: Diagnosis not present

## 2014-03-26 DIAGNOSIS — G609 Hereditary and idiopathic neuropathy, unspecified: Secondary | ICD-10-CM | POA: Diagnosis not present

## 2014-03-26 DIAGNOSIS — E782 Mixed hyperlipidemia: Secondary | ICD-10-CM | POA: Diagnosis not present

## 2014-03-26 DIAGNOSIS — Z0000 Encounter for general adult medical examination without abnormal findings: Secondary | ICD-10-CM | POA: Diagnosis not present

## 2014-03-26 DIAGNOSIS — I10 Essential (primary) hypertension: Secondary | ICD-10-CM | POA: Diagnosis not present

## 2014-03-26 DIAGNOSIS — Z1331 Encounter for screening for depression: Secondary | ICD-10-CM | POA: Diagnosis not present

## 2014-06-19 DIAGNOSIS — H35039 Hypertensive retinopathy, unspecified eye: Secondary | ICD-10-CM | POA: Diagnosis not present

## 2014-06-19 DIAGNOSIS — H524 Presbyopia: Secondary | ICD-10-CM | POA: Diagnosis not present

## 2014-06-19 DIAGNOSIS — Z961 Presence of intraocular lens: Secondary | ICD-10-CM | POA: Diagnosis not present

## 2014-06-19 DIAGNOSIS — H35369 Drusen (degenerative) of macula, unspecified eye: Secondary | ICD-10-CM | POA: Diagnosis not present

## 2014-06-19 DIAGNOSIS — H43819 Vitreous degeneration, unspecified eye: Secondary | ICD-10-CM | POA: Diagnosis not present

## 2014-06-25 IMAGING — CR DG CHEST 2V
2 series · 2 of 2 positions shown · non-contrast
Comparison: 06/23/2008

CLINICAL DATA: Preop

CHEST - 2 VIEW

[view not recorded (1 of 2)]
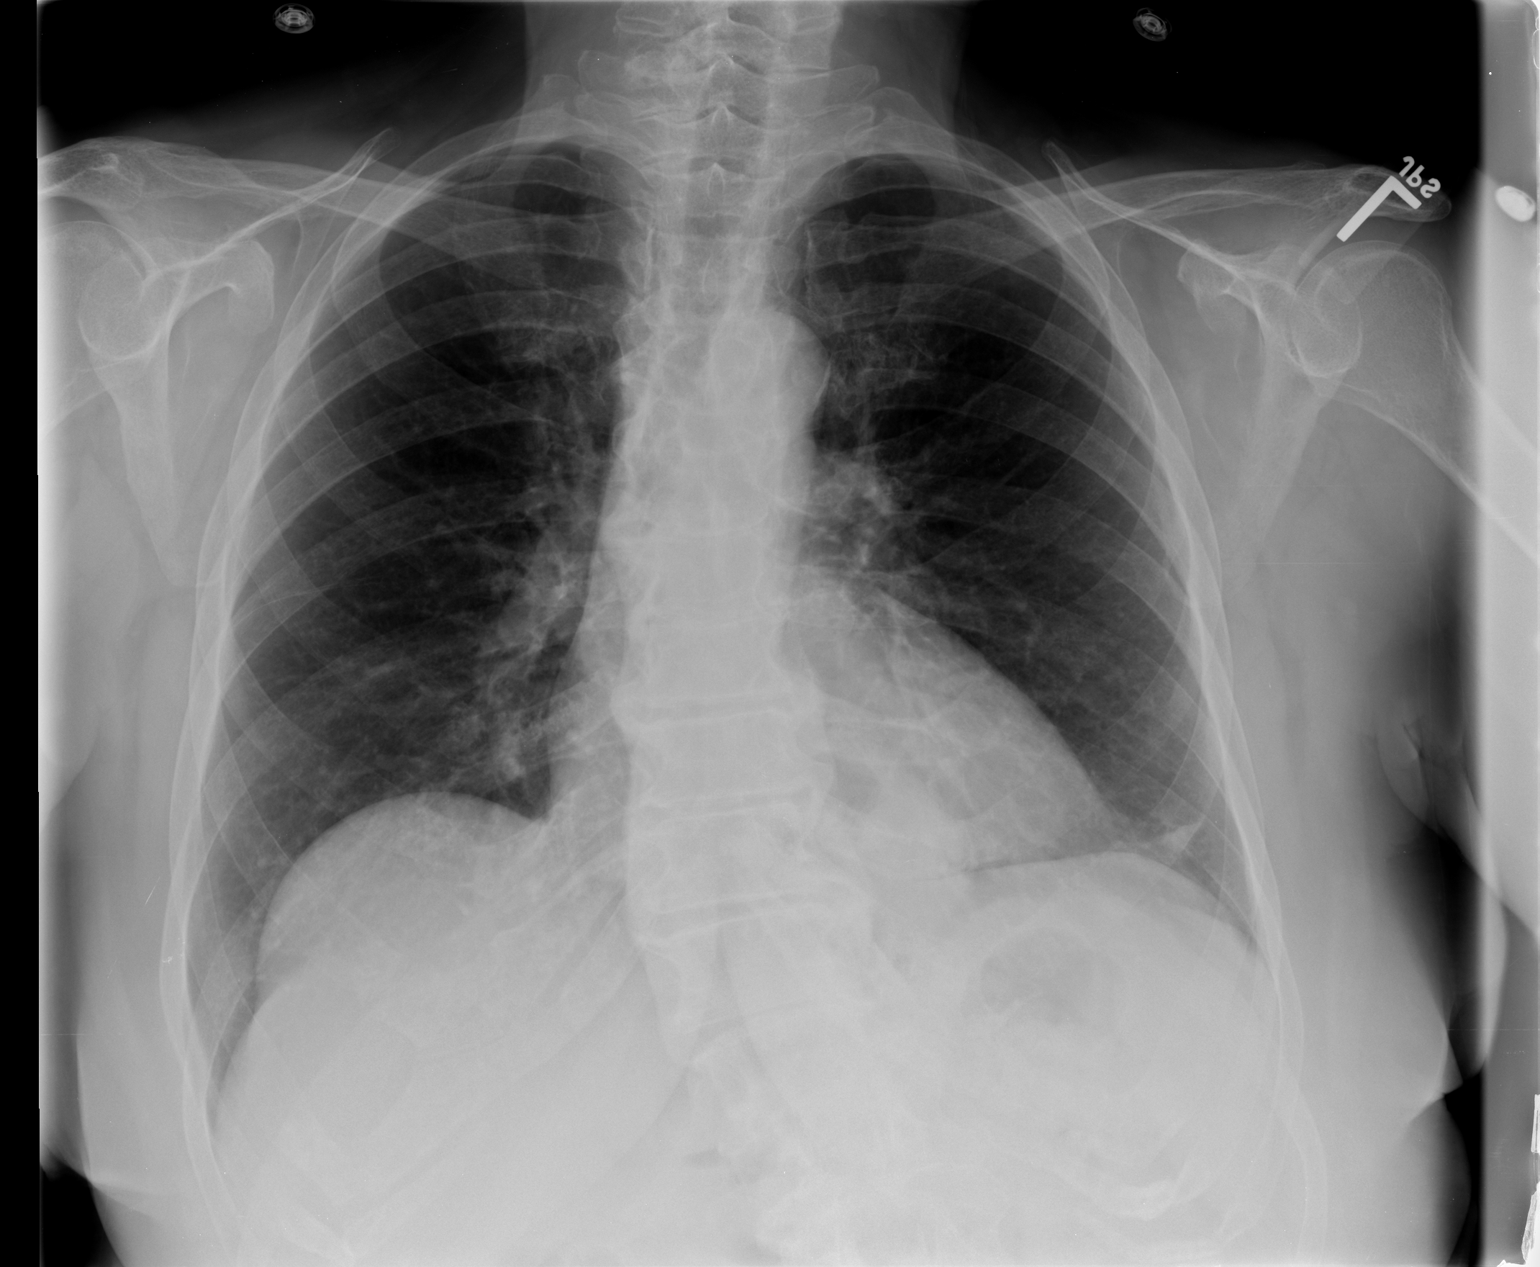

[view not recorded (2 of 2)]
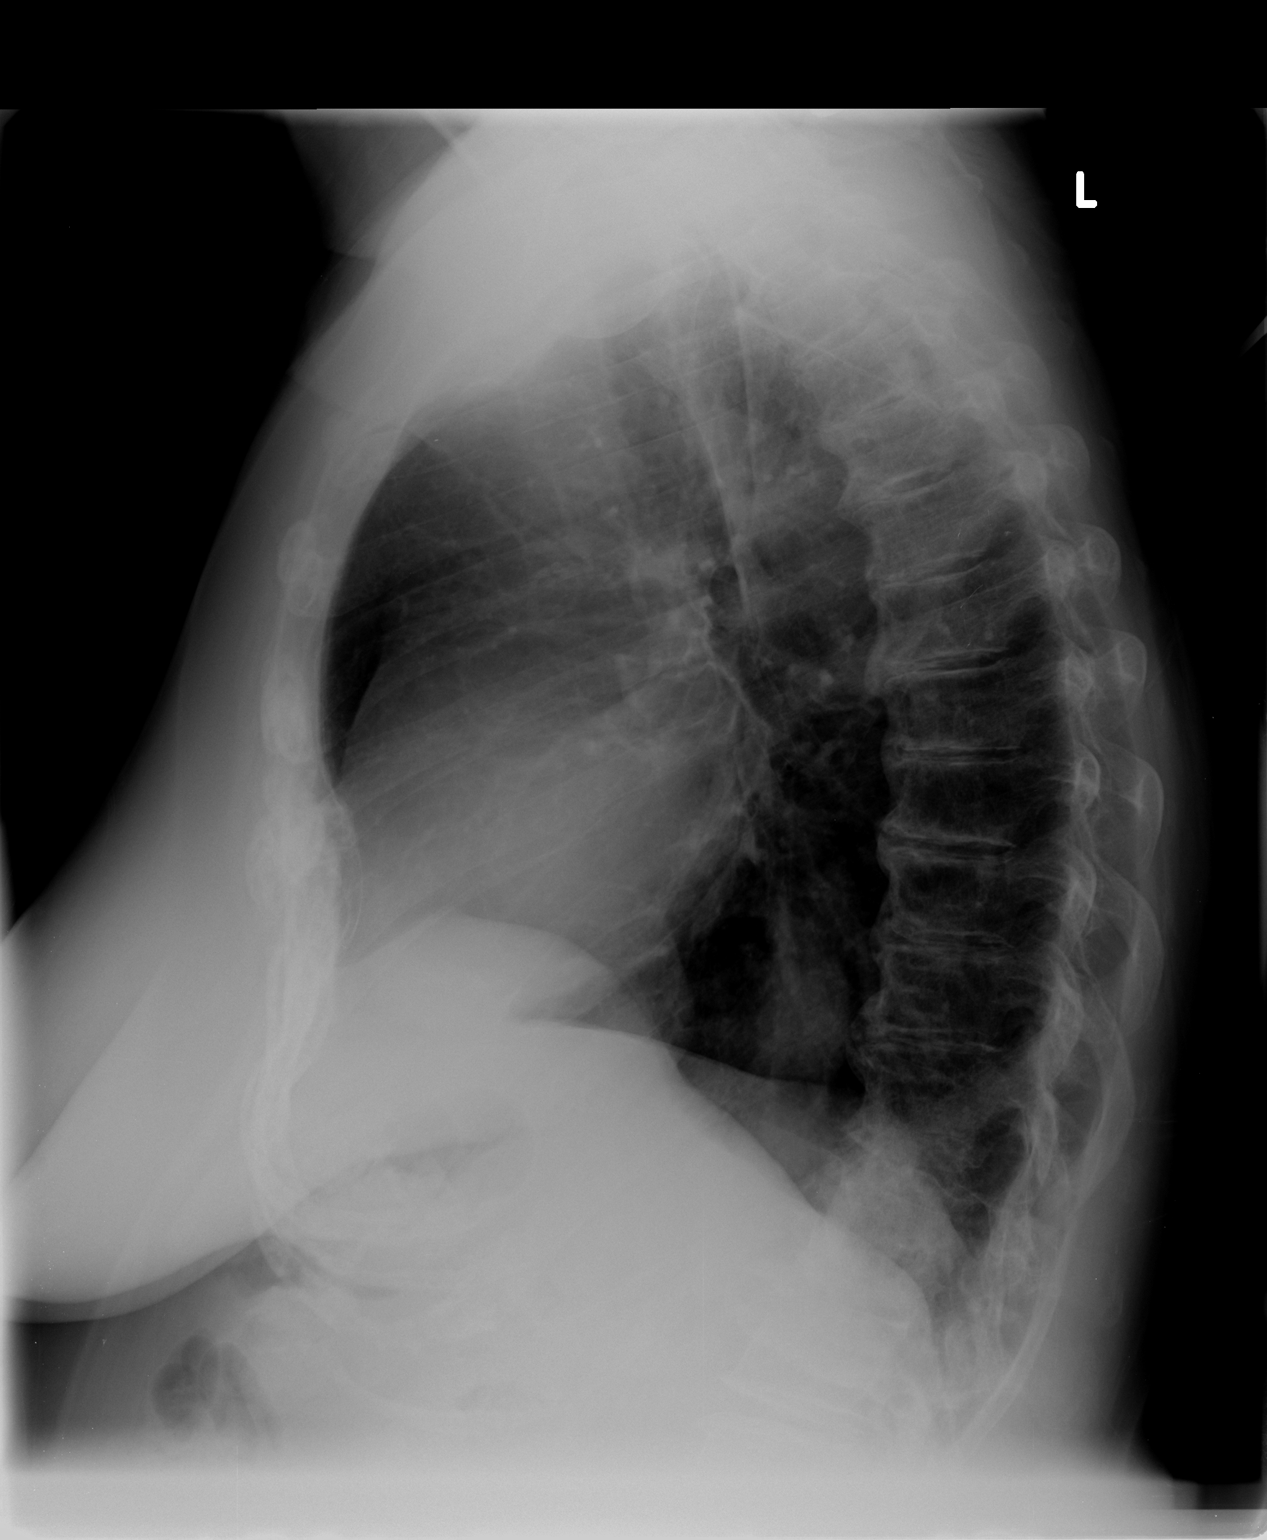

[2 of 2 positions shown; findings below may reference images not displayed]

FINDINGS: Cardiomediastinal silhouette is stable.  No acute
infiltrate or pleural effusion.  Mild thoracic dextroscoliosis.
There is left basilar atelectasis or scarring.  No pulmonary edema.
Osteopenia and mild degenerative changes thoracic spine.
IMPRESSION: No acute infiltrate or pulmonary edema.  Left basilar atelectasis
or scarring.

## 2014-06-29 IMAGING — CR DG KNEE 1-2V PORT*R*
2 series · 2 of 2 positions shown · non-contrast
Comparison: None.

CLINICAL DATA: Postop arthroplasty

PORTABLE RIGHT KNEE - 1-2 VIEW

[AP]
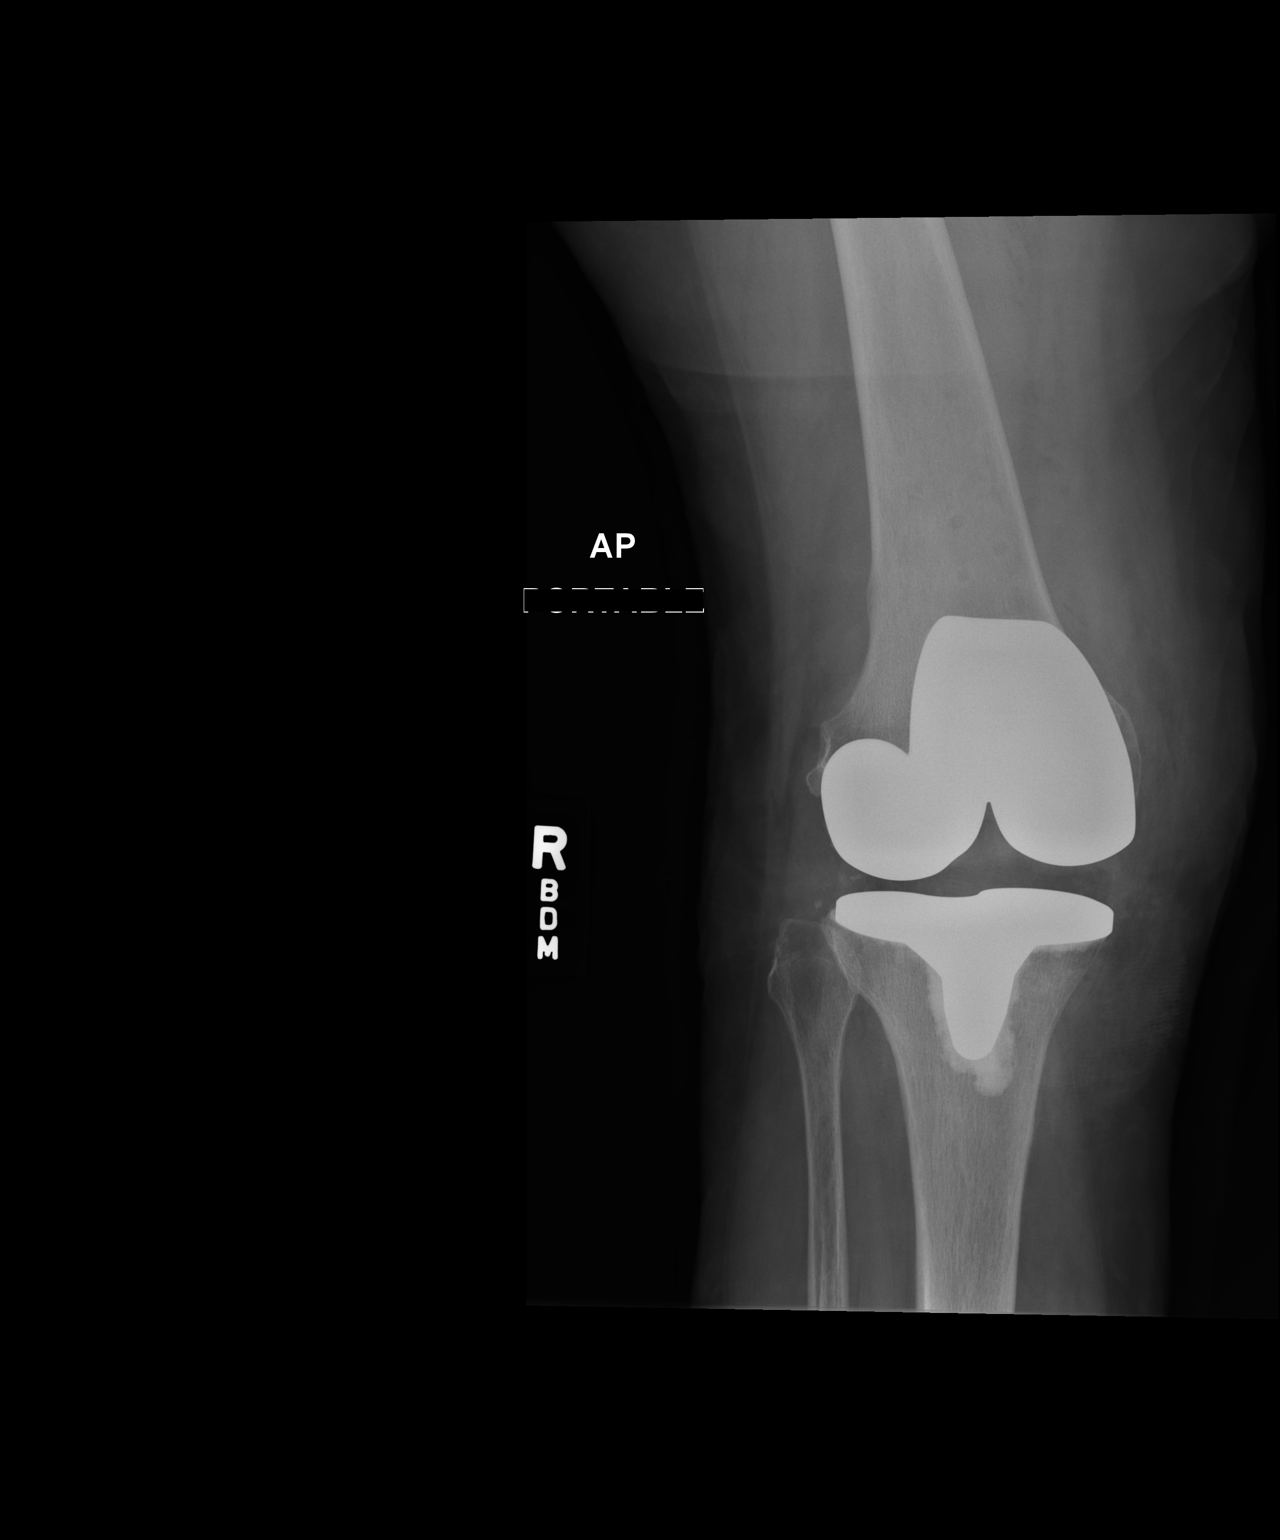

[xtable lateral]
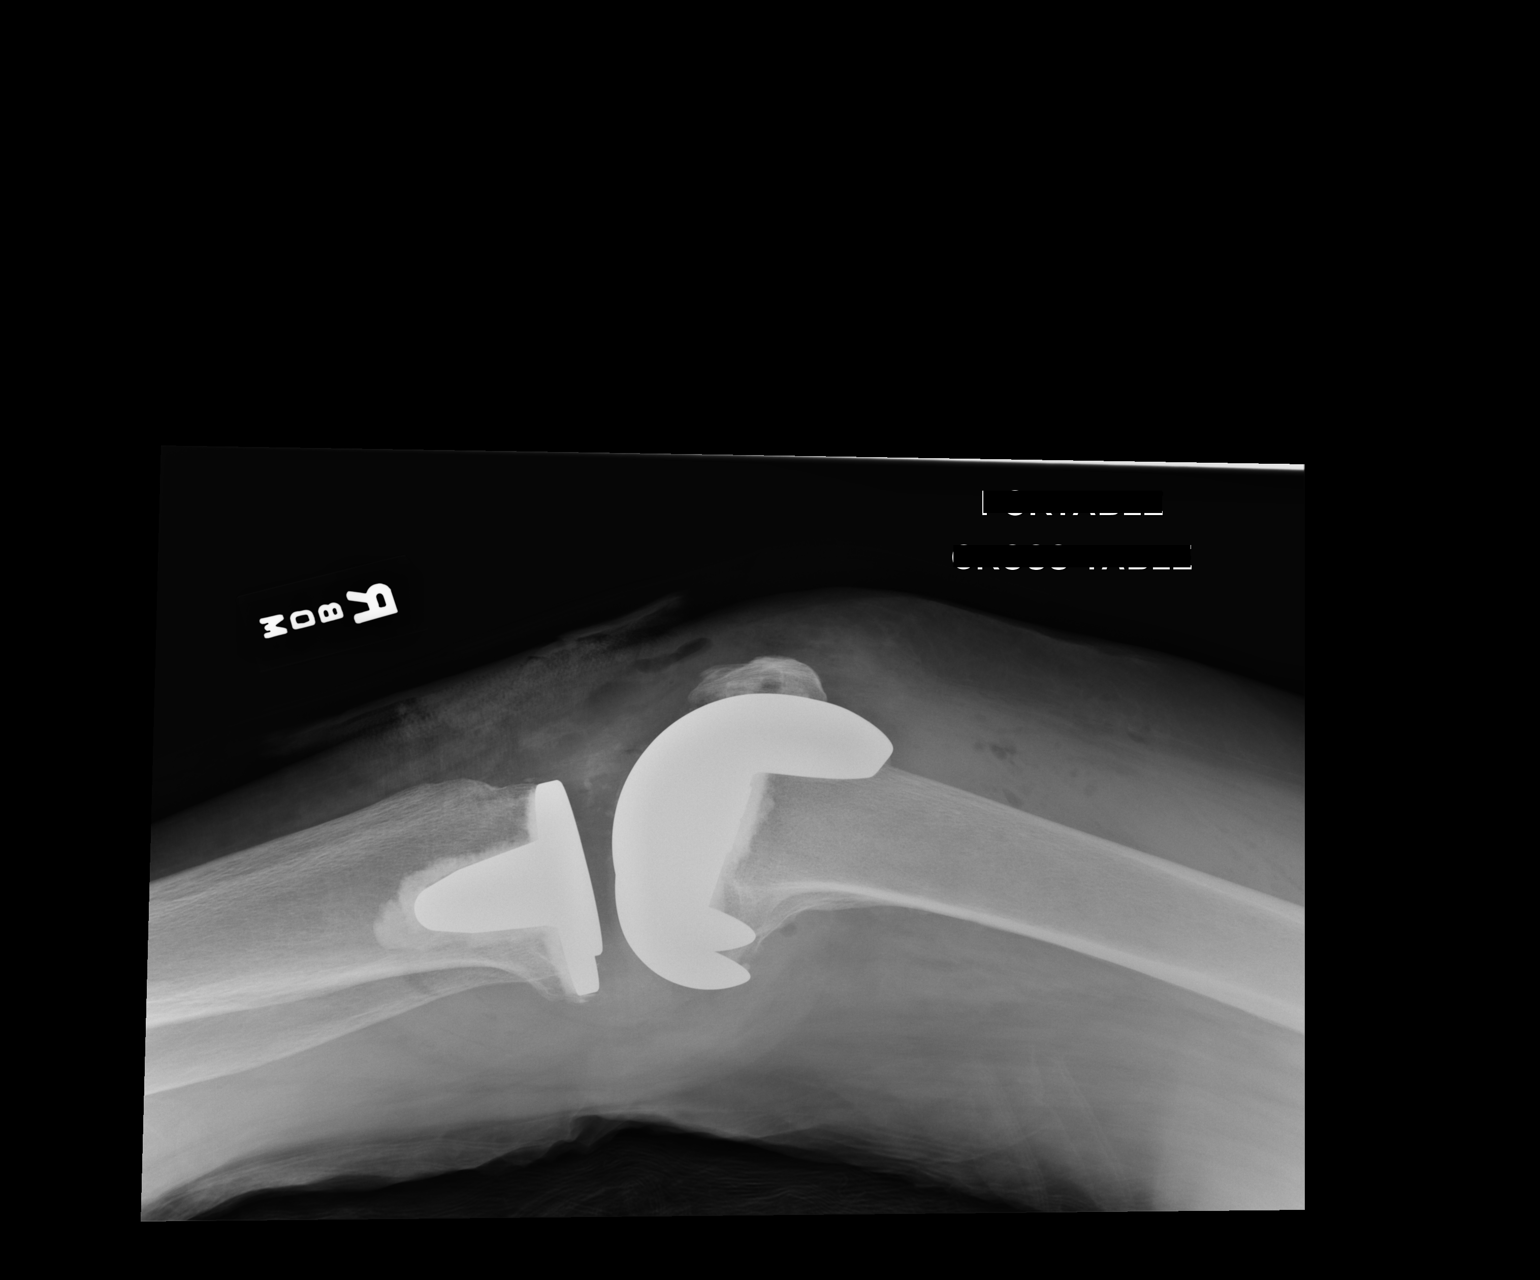

[2 of 2 positions shown; findings below may reference images not displayed]

FINDINGS: Total knee arthroplasty is in place.  Anatomic alignment
of the osseous and prosthetic structures.  No breakage or loosening
of the hardware.  Gas is present in the soft tissues compatible
with recent surgery.
IMPRESSION: Total knee arthroplasty anatomically aligned.

## 2014-08-07 ENCOUNTER — Other Ambulatory Visit: Payer: Self-pay

## 2014-08-07 DIAGNOSIS — Z1231 Encounter for screening mammogram for malignant neoplasm of breast: Secondary | ICD-10-CM

## 2014-08-13 DIAGNOSIS — Z23 Encounter for immunization: Secondary | ICD-10-CM | POA: Diagnosis not present

## 2014-09-11 ENCOUNTER — Ambulatory Visit
Admission: RE | Admit: 2014-09-11 | Discharge: 2014-09-11 | Disposition: A | Discharge status: Home / Self Care | Payer: Medicare Other | Source: Ambulatory Visit

## 2014-09-11 DIAGNOSIS — Z1231 Encounter for screening mammogram for malignant neoplasm of breast: Secondary | ICD-10-CM | POA: Diagnosis not present

## 2014-09-25 ENCOUNTER — Ambulatory Visit
Admission: RE | Admit: 2014-09-25 | Discharge: 2014-09-25 | Disposition: A | Discharge status: Home / Self Care | Payer: Medicare Other | Source: Ambulatory Visit | Attending: Nurse Practitioner | Admitting: Nurse Practitioner

## 2014-09-25 ENCOUNTER — Other Ambulatory Visit: Payer: Self-pay | Admitting: Nurse Practitioner

## 2014-09-25 DIAGNOSIS — M859 Disorder of bone density and structure, unspecified: Secondary | ICD-10-CM | POA: Diagnosis not present

## 2014-09-25 DIAGNOSIS — R0689 Other abnormalities of breathing: Secondary | ICD-10-CM | POA: Diagnosis not present

## 2014-09-25 DIAGNOSIS — K449 Diaphragmatic hernia without obstruction or gangrene: Secondary | ICD-10-CM | POA: Diagnosis not present

## 2014-09-25 DIAGNOSIS — I10 Essential (primary) hypertension: Secondary | ICD-10-CM | POA: Diagnosis not present

## 2014-09-25 DIAGNOSIS — R05 Cough: Secondary | ICD-10-CM | POA: Diagnosis not present

## 2014-09-25 DIAGNOSIS — J069 Acute upper respiratory infection, unspecified: Secondary | ICD-10-CM

## 2014-11-06 DIAGNOSIS — M810 Age-related osteoporosis without current pathological fracture: Secondary | ICD-10-CM | POA: Diagnosis not present

## 2014-11-15 DIAGNOSIS — J040 Acute laryngitis: Secondary | ICD-10-CM | POA: Diagnosis not present

## 2014-11-15 DIAGNOSIS — M810 Age-related osteoporosis without current pathological fracture: Secondary | ICD-10-CM | POA: Diagnosis not present

## 2014-12-24 DIAGNOSIS — J45909 Unspecified asthma, uncomplicated: Secondary | ICD-10-CM | POA: Diagnosis not present

## 2015-03-25 ENCOUNTER — Other Ambulatory Visit: Payer: Self-pay | Admitting: Nurse Practitioner

## 2015-03-25 ENCOUNTER — Ambulatory Visit
Admission: RE | Admit: 2015-03-25 | Discharge: 2015-03-25 | Disposition: A | Discharge status: Home / Self Care | Payer: Medicare Other | Source: Ambulatory Visit | Attending: Nurse Practitioner | Admitting: Nurse Practitioner

## 2015-03-25 DIAGNOSIS — R0602 Shortness of breath: Secondary | ICD-10-CM | POA: Diagnosis not present

## 2015-03-25 DIAGNOSIS — R05 Cough: Secondary | ICD-10-CM | POA: Diagnosis not present

## 2015-03-25 DIAGNOSIS — R059 Cough, unspecified: Secondary | ICD-10-CM

## 2015-04-10 DIAGNOSIS — Z1389 Encounter for screening for other disorder: Secondary | ICD-10-CM | POA: Diagnosis not present

## 2015-04-10 DIAGNOSIS — M810 Age-related osteoporosis without current pathological fracture: Secondary | ICD-10-CM | POA: Diagnosis not present

## 2015-04-10 DIAGNOSIS — G629 Polyneuropathy, unspecified: Secondary | ICD-10-CM | POA: Diagnosis not present

## 2015-04-10 DIAGNOSIS — M4800 Spinal stenosis, site unspecified: Secondary | ICD-10-CM | POA: Diagnosis not present

## 2015-04-10 DIAGNOSIS — D692 Other nonthrombocytopenic purpura: Secondary | ICD-10-CM | POA: Diagnosis not present

## 2015-04-10 DIAGNOSIS — Z79899 Other long term (current) drug therapy: Secondary | ICD-10-CM | POA: Diagnosis not present

## 2015-04-10 DIAGNOSIS — Z0000 Encounter for general adult medical examination without abnormal findings: Secondary | ICD-10-CM | POA: Diagnosis not present

## 2015-04-10 DIAGNOSIS — I10 Essential (primary) hypertension: Secondary | ICD-10-CM | POA: Diagnosis not present

## 2015-05-10 DIAGNOSIS — M810 Age-related osteoporosis without current pathological fracture: Secondary | ICD-10-CM | POA: Diagnosis not present

## 2015-05-13 DIAGNOSIS — M810 Age-related osteoporosis without current pathological fracture: Secondary | ICD-10-CM | POA: Diagnosis not present

## 2015-07-08 DIAGNOSIS — H43812 Vitreous degeneration, left eye: Secondary | ICD-10-CM | POA: Diagnosis not present

## 2015-07-08 DIAGNOSIS — H35042 Retinal micro-aneurysms, unspecified, left eye: Secondary | ICD-10-CM | POA: Diagnosis not present

## 2015-07-16 DIAGNOSIS — H3562 Retinal hemorrhage, left eye: Secondary | ICD-10-CM | POA: Diagnosis not present

## 2015-07-16 DIAGNOSIS — H35033 Hypertensive retinopathy, bilateral: Secondary | ICD-10-CM | POA: Diagnosis not present

## 2015-07-16 DIAGNOSIS — H3509 Other intraretinal microvascular abnormalities: Secondary | ICD-10-CM | POA: Diagnosis not present

## 2015-07-18 DIAGNOSIS — R05 Cough: Secondary | ICD-10-CM | POA: Diagnosis not present

## 2015-07-18 DIAGNOSIS — J40 Bronchitis, not specified as acute or chronic: Secondary | ICD-10-CM | POA: Diagnosis not present

## 2015-07-18 DIAGNOSIS — R062 Wheezing: Secondary | ICD-10-CM | POA: Diagnosis not present

## 2015-08-12 DIAGNOSIS — Z23 Encounter for immunization: Secondary | ICD-10-CM | POA: Diagnosis not present

## 2015-08-15 ENCOUNTER — Other Ambulatory Visit: Payer: Self-pay

## 2015-08-15 DIAGNOSIS — Z1231 Encounter for screening mammogram for malignant neoplasm of breast: Secondary | ICD-10-CM

## 2015-08-20 DIAGNOSIS — H3562 Retinal hemorrhage, left eye: Secondary | ICD-10-CM | POA: Diagnosis not present

## 2015-08-20 DIAGNOSIS — H35032 Hypertensive retinopathy, left eye: Secondary | ICD-10-CM | POA: Diagnosis not present

## 2015-09-19 ENCOUNTER — Ambulatory Visit
Admission: RE | Admit: 2015-09-19 | Discharge: 2015-09-19 | Disposition: A | Discharge status: Home / Self Care | Payer: Medicare Other | Source: Ambulatory Visit

## 2015-09-19 DIAGNOSIS — Z1231 Encounter for screening mammogram for malignant neoplasm of breast: Secondary | ICD-10-CM | POA: Diagnosis not present

## 2015-11-05 DIAGNOSIS — M810 Age-related osteoporosis without current pathological fracture: Secondary | ICD-10-CM | POA: Diagnosis not present

## 2015-11-14 DIAGNOSIS — M810 Age-related osteoporosis without current pathological fracture: Secondary | ICD-10-CM | POA: Diagnosis not present

## 2015-11-20 DIAGNOSIS — H43813 Vitreous degeneration, bilateral: Secondary | ICD-10-CM | POA: Diagnosis not present

## 2015-11-20 DIAGNOSIS — H35031 Hypertensive retinopathy, right eye: Secondary | ICD-10-CM | POA: Diagnosis not present

## 2016-01-30 ENCOUNTER — Encounter: Payer: Self-pay | Admitting: Podiatry

## 2016-01-30 ENCOUNTER — Ambulatory Visit (INDEPENDENT_AMBULATORY_CARE_PROVIDER_SITE_OTHER): Payer: Medicare Other | Admitting: Podiatry

## 2016-01-30 ENCOUNTER — Ambulatory Visit (INDEPENDENT_AMBULATORY_CARE_PROVIDER_SITE_OTHER): Payer: Medicare Other

## 2016-01-30 VITALS — BP 134/68 | HR 70 | Resp 12

## 2016-01-30 DIAGNOSIS — M779 Enthesopathy, unspecified: Secondary | ICD-10-CM

## 2016-01-30 DIAGNOSIS — M2042 Other hammer toe(s) (acquired), left foot: Secondary | ICD-10-CM

## 2016-01-30 MED ORDER — TRIAMCINOLONE ACETONIDE 10 MG/ML IJ SUSP
10.0000 mg | Freq: Once | INTRAMUSCULAR | Status: AC
  Administered 2016-01-30: 10 mg

## 2016-01-30 NOTE — Progress Notes (Signed)
   Subjective:    Patient ID: Ashley Cabrera, female    DOB: 1934/03/22, 82 y.o.   MRN: [id number]  HPI  PT STATED LT BALL OF THE FOOT IS BEEN HURTING FOR 7 MONTHS. FOOT IS GETTING WORSE ESPECIALLY WHEN WALKING. TRIED NO TREATMENT.  Review of Systems  HENT: Positive for sinus pressure.        Objective:   Physical Exam        Assessment & Plan:

## 2016-02-02 NOTE — Progress Notes (Signed)
Subjective:     Patient ID: Ashley Cabrera, female   DOB: 08/27/1934, 82 y.o.   MRN: [id number]  HPI patient presents stating she's been getting a lot of pain underneath the left foot with inflammation around the joint   Review of Systems  All other systems reviewed and are negative.      Objective:   Physical Exam  Constitutional: She is oriented to person, place, and time.  Cardiovascular: Intact distal pulses.   Musculoskeletal: Normal range of motion.  Neurological: She is oriented to person, place, and time.  Skin: Skin is warm.  Nursing note and vitals reviewed.  Neurovascular status found to be intact muscle strength adequate range of motion within normal limits with patient noted to have inflammatory changes underneath the left foot fifth metatarsal phalangeal joint with fluid buildup around the joint surface. I also noted cavus foot type with plantarflexed metatarsal and patient's found to have good digital perfusion and is well oriented 3     Assessment:     Inflammatory capsulitis fifth MPJ left with lesion formation    Plan:     H&P conditions reviewed with patient and today I did careful capsular injection 3 mg dexamethasone Kenalog 5 mg Xylocaine padded the area and advised on reduced activity. Reappoint as needed and may require surgery which I educated her on today  X-ray report indicates pressure around the fifth metatarsophalangeal joint with no indications of stress fracture

## 2016-02-17 ENCOUNTER — Ambulatory Visit (INDEPENDENT_AMBULATORY_CARE_PROVIDER_SITE_OTHER): Payer: Medicare Other | Admitting: Podiatry

## 2016-02-17 ENCOUNTER — Encounter: Payer: Self-pay | Admitting: Podiatry

## 2016-02-17 DIAGNOSIS — M779 Enthesopathy, unspecified: Secondary | ICD-10-CM | POA: Diagnosis not present

## 2016-02-17 DIAGNOSIS — M2042 Other hammer toe(s) (acquired), left foot: Secondary | ICD-10-CM | POA: Diagnosis not present

## 2016-02-19 NOTE — Progress Notes (Signed)
Subjective:     Patient ID: Ashley Cabrera, female   DOB: 10/24/1933, 82 y.o.   MRN: [id number]  HPI patient presents stating the pain in my left foot has improved quite a bit with minimal discomfort and as long as and wearing shoes I'm pain-free   Review of Systems     Objective:   Physical Exam Neurovascular status intact muscle strength adequate range of motion within normal limits with patient having mild discomfort in the metatarsophalangeal joint left with fluid buildup but significant improvement from previous    Assessment:     Inflammatory capsulitis improving    Plan:     Advised on physical therapy anti-inflammatories supportive shoes and patient will be seen back as needed

## 2016-04-14 DIAGNOSIS — M4800 Spinal stenosis, site unspecified: Secondary | ICD-10-CM | POA: Diagnosis not present

## 2016-04-14 DIAGNOSIS — Z79899 Other long term (current) drug therapy: Secondary | ICD-10-CM | POA: Diagnosis not present

## 2016-04-14 DIAGNOSIS — I10 Essential (primary) hypertension: Secondary | ICD-10-CM | POA: Diagnosis not present

## 2016-04-14 DIAGNOSIS — K219 Gastro-esophageal reflux disease without esophagitis: Secondary | ICD-10-CM | POA: Diagnosis not present

## 2016-04-14 DIAGNOSIS — Z1389 Encounter for screening for other disorder: Secondary | ICD-10-CM | POA: Diagnosis not present

## 2016-04-14 DIAGNOSIS — Z0000 Encounter for general adult medical examination without abnormal findings: Secondary | ICD-10-CM | POA: Diagnosis not present

## 2016-04-14 DIAGNOSIS — G629 Polyneuropathy, unspecified: Secondary | ICD-10-CM | POA: Diagnosis not present

## 2016-04-27 DIAGNOSIS — Z79899 Other long term (current) drug therapy: Secondary | ICD-10-CM | POA: Diagnosis not present

## 2016-04-27 DIAGNOSIS — I10 Essential (primary) hypertension: Secondary | ICD-10-CM | POA: Diagnosis not present

## 2016-04-27 DIAGNOSIS — M810 Age-related osteoporosis without current pathological fracture: Secondary | ICD-10-CM | POA: Diagnosis not present

## 2016-05-14 DIAGNOSIS — M810 Age-related osteoporosis without current pathological fracture: Secondary | ICD-10-CM | POA: Diagnosis not present

## 2016-07-08 DIAGNOSIS — Z961 Presence of intraocular lens: Secondary | ICD-10-CM | POA: Diagnosis not present

## 2016-07-08 DIAGNOSIS — H35033 Hypertensive retinopathy, bilateral: Secondary | ICD-10-CM | POA: Diagnosis not present

## 2016-07-08 DIAGNOSIS — H43811 Vitreous degeneration, right eye: Secondary | ICD-10-CM | POA: Diagnosis not present

## 2016-07-08 DIAGNOSIS — H26491 Other secondary cataract, right eye: Secondary | ICD-10-CM | POA: Diagnosis not present

## 2016-09-02 ENCOUNTER — Other Ambulatory Visit: Payer: Self-pay | Admitting: Geriatric Medicine

## 2016-09-02 DIAGNOSIS — Z1231 Encounter for screening mammogram for malignant neoplasm of breast: Secondary | ICD-10-CM

## 2016-10-20 DIAGNOSIS — I10 Essential (primary) hypertension: Secondary | ICD-10-CM | POA: Diagnosis not present

## 2016-10-22 ENCOUNTER — Ambulatory Visit: Payer: Medicare Other

## 2016-11-03 DIAGNOSIS — M810 Age-related osteoporosis without current pathological fracture: Secondary | ICD-10-CM | POA: Diagnosis not present

## 2016-11-17 DIAGNOSIS — M810 Age-related osteoporosis without current pathological fracture: Secondary | ICD-10-CM | POA: Diagnosis not present

## 2016-11-20 ENCOUNTER — Ambulatory Visit
Admission: RE | Admit: 2016-11-20 | Discharge: 2016-11-20 | Disposition: A | Discharge status: Home / Self Care | Payer: Medicare Other | Source: Ambulatory Visit | Attending: Geriatric Medicine | Admitting: Geriatric Medicine

## 2016-11-20 DIAGNOSIS — Z1231 Encounter for screening mammogram for malignant neoplasm of breast: Secondary | ICD-10-CM | POA: Diagnosis not present

## 2017-04-30 DIAGNOSIS — M810 Age-related osteoporosis without current pathological fracture: Secondary | ICD-10-CM | POA: Diagnosis not present

## 2017-04-30 DIAGNOSIS — Z0000 Encounter for general adult medical examination without abnormal findings: Secondary | ICD-10-CM | POA: Diagnosis not present

## 2017-04-30 DIAGNOSIS — Z1389 Encounter for screening for other disorder: Secondary | ICD-10-CM | POA: Diagnosis not present

## 2017-07-28 DIAGNOSIS — Z23 Encounter for immunization: Secondary | ICD-10-CM | POA: Diagnosis not present

## 2017-08-26 DIAGNOSIS — H04123 Dry eye syndrome of bilateral lacrimal glands: Secondary | ICD-10-CM | POA: Diagnosis not present

## 2017-08-26 DIAGNOSIS — H35033 Hypertensive retinopathy, bilateral: Secondary | ICD-10-CM | POA: Diagnosis not present

## 2017-08-26 DIAGNOSIS — H43813 Vitreous degeneration, bilateral: Secondary | ICD-10-CM | POA: Diagnosis not present

## 2017-08-26 DIAGNOSIS — Z961 Presence of intraocular lens: Secondary | ICD-10-CM | POA: Diagnosis not present

## 2017-09-17 DIAGNOSIS — I10 Essential (primary) hypertension: Secondary | ICD-10-CM | POA: Diagnosis not present

## 2017-09-17 DIAGNOSIS — J45909 Unspecified asthma, uncomplicated: Secondary | ICD-10-CM | POA: Diagnosis not present

## 2017-09-17 DIAGNOSIS — M810 Age-related osteoporosis without current pathological fracture: Secondary | ICD-10-CM | POA: Diagnosis not present

## 2017-10-26 ENCOUNTER — Other Ambulatory Visit: Payer: Self-pay | Admitting: Geriatric Medicine

## 2017-10-26 DIAGNOSIS — Z139 Encounter for screening, unspecified: Secondary | ICD-10-CM

## 2017-11-02 DIAGNOSIS — M810 Age-related osteoporosis without current pathological fracture: Secondary | ICD-10-CM | POA: Diagnosis not present

## 2017-11-02 DIAGNOSIS — Z79899 Other long term (current) drug therapy: Secondary | ICD-10-CM | POA: Diagnosis not present

## 2017-11-02 DIAGNOSIS — I10 Essential (primary) hypertension: Secondary | ICD-10-CM | POA: Diagnosis not present

## 2017-11-02 DIAGNOSIS — J309 Allergic rhinitis, unspecified: Secondary | ICD-10-CM | POA: Diagnosis not present

## 2017-11-22 ENCOUNTER — Ambulatory Visit
Admission: RE | Admit: 2017-11-22 | Discharge: 2017-11-22 | Disposition: A | Discharge status: Home / Self Care | Payer: Medicare Other | Source: Ambulatory Visit | Attending: Geriatric Medicine | Admitting: Geriatric Medicine

## 2017-11-22 DIAGNOSIS — Z1231 Encounter for screening mammogram for malignant neoplasm of breast: Secondary | ICD-10-CM | POA: Diagnosis not present

## 2017-11-22 DIAGNOSIS — Z139 Encounter for screening, unspecified: Secondary | ICD-10-CM

## 2018-01-27 DIAGNOSIS — Z79899 Other long term (current) drug therapy: Secondary | ICD-10-CM | POA: Diagnosis not present

## 2018-01-27 DIAGNOSIS — I10 Essential (primary) hypertension: Secondary | ICD-10-CM | POA: Diagnosis not present

## 2018-01-27 DIAGNOSIS — K642 Third degree hemorrhoids: Secondary | ICD-10-CM | POA: Diagnosis not present

## 2018-01-27 DIAGNOSIS — K5649 Other impaction of intestine: Secondary | ICD-10-CM | POA: Diagnosis not present

## 2018-03-29 DIAGNOSIS — I10 Essential (primary) hypertension: Secondary | ICD-10-CM | POA: Diagnosis not present

## 2018-03-29 DIAGNOSIS — J309 Allergic rhinitis, unspecified: Secondary | ICD-10-CM | POA: Diagnosis not present

## 2018-05-10 DIAGNOSIS — M810 Age-related osteoporosis without current pathological fracture: Secondary | ICD-10-CM | POA: Diagnosis not present

## 2018-06-02 DIAGNOSIS — I10 Essential (primary) hypertension: Secondary | ICD-10-CM | POA: Diagnosis not present

## 2018-06-02 DIAGNOSIS — Z79899 Other long term (current) drug therapy: Secondary | ICD-10-CM | POA: Diagnosis not present

## 2018-06-02 DIAGNOSIS — Z0000 Encounter for general adult medical examination without abnormal findings: Secondary | ICD-10-CM | POA: Diagnosis not present

## 2018-06-02 DIAGNOSIS — J309 Allergic rhinitis, unspecified: Secondary | ICD-10-CM | POA: Diagnosis not present

## 2018-06-02 DIAGNOSIS — J4541 Moderate persistent asthma with (acute) exacerbation: Secondary | ICD-10-CM | POA: Diagnosis not present

## 2018-06-02 DIAGNOSIS — Z1389 Encounter for screening for other disorder: Secondary | ICD-10-CM | POA: Diagnosis not present

## 2018-06-22 ENCOUNTER — Other Ambulatory Visit: Payer: Self-pay | Admitting: Geriatric Medicine

## 2018-06-22 ENCOUNTER — Ambulatory Visit
Admission: RE | Admit: 2018-06-22 | Discharge: 2018-06-22 | Disposition: A | Discharge status: Home / Self Care | Payer: Medicare Other | Source: Ambulatory Visit | Attending: Geriatric Medicine | Admitting: Geriatric Medicine

## 2018-06-22 DIAGNOSIS — J4541 Moderate persistent asthma with (acute) exacerbation: Secondary | ICD-10-CM | POA: Diagnosis not present

## 2018-06-22 DIAGNOSIS — R0981 Nasal congestion: Secondary | ICD-10-CM

## 2018-06-22 DIAGNOSIS — I10 Essential (primary) hypertension: Secondary | ICD-10-CM | POA: Diagnosis not present

## 2018-06-22 DIAGNOSIS — Z23 Encounter for immunization: Secondary | ICD-10-CM | POA: Diagnosis not present

## 2018-09-12 DIAGNOSIS — H43813 Vitreous degeneration, bilateral: Secondary | ICD-10-CM | POA: Diagnosis not present

## 2018-09-12 DIAGNOSIS — H26493 Other secondary cataract, bilateral: Secondary | ICD-10-CM | POA: Diagnosis not present

## 2018-09-12 DIAGNOSIS — Z961 Presence of intraocular lens: Secondary | ICD-10-CM | POA: Diagnosis not present

## 2018-09-12 DIAGNOSIS — H35033 Hypertensive retinopathy, bilateral: Secondary | ICD-10-CM | POA: Diagnosis not present

## 2018-10-25 DIAGNOSIS — H26492 Other secondary cataract, left eye: Secondary | ICD-10-CM | POA: Diagnosis not present

## 2018-10-28 ENCOUNTER — Other Ambulatory Visit: Payer: Self-pay | Admitting: Geriatric Medicine

## 2018-10-28 DIAGNOSIS — Z1231 Encounter for screening mammogram for malignant neoplasm of breast: Secondary | ICD-10-CM

## 2018-11-14 DIAGNOSIS — J4540 Moderate persistent asthma, uncomplicated: Secondary | ICD-10-CM | POA: Diagnosis not present

## 2018-11-14 DIAGNOSIS — M810 Age-related osteoporosis without current pathological fracture: Secondary | ICD-10-CM | POA: Diagnosis not present

## 2018-11-14 DIAGNOSIS — I10 Essential (primary) hypertension: Secondary | ICD-10-CM | POA: Diagnosis not present

## 2018-11-14 DIAGNOSIS — Z79899 Other long term (current) drug therapy: Secondary | ICD-10-CM | POA: Diagnosis not present

## 2018-11-30 ENCOUNTER — Ambulatory Visit: Payer: Medicare Other

## 2018-12-07 DIAGNOSIS — M810 Age-related osteoporosis without current pathological fracture: Secondary | ICD-10-CM | POA: Diagnosis not present

## 2018-12-07 DIAGNOSIS — I10 Essential (primary) hypertension: Secondary | ICD-10-CM | POA: Diagnosis not present

## 2018-12-07 DIAGNOSIS — J4541 Moderate persistent asthma with (acute) exacerbation: Secondary | ICD-10-CM | POA: Diagnosis not present

## 2018-12-07 DIAGNOSIS — J4540 Moderate persistent asthma, uncomplicated: Secondary | ICD-10-CM | POA: Diagnosis not present

## 2018-12-20 DIAGNOSIS — I10 Essential (primary) hypertension: Secondary | ICD-10-CM | POA: Diagnosis not present

## 2018-12-28 ENCOUNTER — Other Ambulatory Visit: Payer: Self-pay

## 2018-12-28 ENCOUNTER — Ambulatory Visit
Admission: RE | Admit: 2018-12-28 | Discharge: 2018-12-28 | Disposition: A | Discharge status: Home / Self Care | Payer: PPO | Source: Ambulatory Visit | Attending: Geriatric Medicine | Admitting: Geriatric Medicine

## 2018-12-28 DIAGNOSIS — Z1231 Encounter for screening mammogram for malignant neoplasm of breast: Secondary | ICD-10-CM

## 2019-01-16 DIAGNOSIS — J4540 Moderate persistent asthma, uncomplicated: Secondary | ICD-10-CM | POA: Diagnosis not present

## 2019-01-16 DIAGNOSIS — M810 Age-related osteoporosis without current pathological fracture: Secondary | ICD-10-CM | POA: Diagnosis not present

## 2019-01-16 DIAGNOSIS — J4541 Moderate persistent asthma with (acute) exacerbation: Secondary | ICD-10-CM | POA: Diagnosis not present

## 2019-01-16 DIAGNOSIS — I10 Essential (primary) hypertension: Secondary | ICD-10-CM | POA: Diagnosis not present

## 2019-02-17 ENCOUNTER — Other Ambulatory Visit: Payer: Self-pay | Admitting: *Deleted

## 2019-02-17 NOTE — Patient Outreach (Signed)
[facility]) Care Management  02/17/2019  Ashley Cabrera [birth year] [id number]   RN Health coach sent a welcome letter and HTA package as a [employer] health plan.   Plan: Next follow up outreach within the month of October 6 months.  [place] Care Management [phone]

## 2019-03-08 DIAGNOSIS — I10 Essential (primary) hypertension: Secondary | ICD-10-CM | POA: Diagnosis not present

## 2019-03-08 DIAGNOSIS — J4541 Moderate persistent asthma with (acute) exacerbation: Secondary | ICD-10-CM | POA: Diagnosis not present

## 2019-03-08 DIAGNOSIS — J4540 Moderate persistent asthma, uncomplicated: Secondary | ICD-10-CM | POA: Diagnosis not present

## 2019-03-08 DIAGNOSIS — M810 Age-related osteoporosis without current pathological fracture: Secondary | ICD-10-CM | POA: Diagnosis not present

## 2019-03-22 DIAGNOSIS — I10 Essential (primary) hypertension: Secondary | ICD-10-CM | POA: Diagnosis not present

## 2019-03-22 DIAGNOSIS — J4540 Moderate persistent asthma, uncomplicated: Secondary | ICD-10-CM | POA: Diagnosis not present

## 2019-03-22 DIAGNOSIS — M810 Age-related osteoporosis without current pathological fracture: Secondary | ICD-10-CM | POA: Diagnosis not present

## 2019-03-22 DIAGNOSIS — J4541 Moderate persistent asthma with (acute) exacerbation: Secondary | ICD-10-CM | POA: Diagnosis not present

## 2019-04-06 ENCOUNTER — Other Ambulatory Visit: Payer: Self-pay | Admitting: *Deleted

## 2019-04-06 NOTE — Patient Outreach (Signed)
[facility]) Care Management  04/06/2019  Ashley Cabrera Ashley Cabrera 1934/05/27 [id number]   RN Health Coach is closing this program. Consumer is enrolled in [employer] external program.  [place] Care Management [phone]

## 2019-05-10 DIAGNOSIS — M810 Age-related osteoporosis without current pathological fracture: Secondary | ICD-10-CM | POA: Diagnosis not present

## 2019-05-10 DIAGNOSIS — I10 Essential (primary) hypertension: Secondary | ICD-10-CM | POA: Diagnosis not present

## 2019-05-10 DIAGNOSIS — J4541 Moderate persistent asthma with (acute) exacerbation: Secondary | ICD-10-CM | POA: Diagnosis not present

## 2019-05-10 DIAGNOSIS — J4540 Moderate persistent asthma, uncomplicated: Secondary | ICD-10-CM | POA: Diagnosis not present

## 2019-05-24 DIAGNOSIS — H26491 Other secondary cataract, right eye: Secondary | ICD-10-CM | POA: Diagnosis not present

## 2019-05-24 DIAGNOSIS — Z961 Presence of intraocular lens: Secondary | ICD-10-CM | POA: Diagnosis not present

## 2019-05-24 DIAGNOSIS — H35033 Hypertensive retinopathy, bilateral: Secondary | ICD-10-CM | POA: Diagnosis not present

## 2019-05-24 DIAGNOSIS — H43813 Vitreous degeneration, bilateral: Secondary | ICD-10-CM | POA: Diagnosis not present

## 2019-05-30 DIAGNOSIS — M810 Age-related osteoporosis without current pathological fracture: Secondary | ICD-10-CM | POA: Diagnosis not present

## 2019-06-08 DIAGNOSIS — I10 Essential (primary) hypertension: Secondary | ICD-10-CM | POA: Diagnosis not present

## 2019-06-08 DIAGNOSIS — J4541 Moderate persistent asthma with (acute) exacerbation: Secondary | ICD-10-CM | POA: Diagnosis not present

## 2019-06-08 DIAGNOSIS — J4540 Moderate persistent asthma, uncomplicated: Secondary | ICD-10-CM | POA: Diagnosis not present

## 2019-06-08 DIAGNOSIS — M810 Age-related osteoporosis without current pathological fracture: Secondary | ICD-10-CM | POA: Diagnosis not present

## 2019-06-29 DIAGNOSIS — Z23 Encounter for immunization: Secondary | ICD-10-CM | POA: Diagnosis not present

## 2019-07-17 DIAGNOSIS — I10 Essential (primary) hypertension: Secondary | ICD-10-CM | POA: Diagnosis not present

## 2019-07-17 DIAGNOSIS — J4541 Moderate persistent asthma with (acute) exacerbation: Secondary | ICD-10-CM | POA: Diagnosis not present

## 2019-07-17 DIAGNOSIS — M810 Age-related osteoporosis without current pathological fracture: Secondary | ICD-10-CM | POA: Diagnosis not present

## 2019-07-17 DIAGNOSIS — J4540 Moderate persistent asthma, uncomplicated: Secondary | ICD-10-CM | POA: Diagnosis not present

## 2019-07-20 ENCOUNTER — Ambulatory Visit: Payer: PPO | Admitting: *Deleted

## 2019-07-21 ENCOUNTER — Emergency Department (HOSPITAL_COMMUNITY): Payer: PPO

## 2019-07-21 ENCOUNTER — Encounter (HOSPITAL_COMMUNITY): Payer: Self-pay

## 2019-07-21 ENCOUNTER — Emergency Department (HOSPITAL_COMMUNITY)
Admission: EM | Admit: 2019-07-21 | Discharge: 2019-07-21 | Disposition: A | Discharge status: Home / Self Care | Payer: PPO | Attending: Emergency Medicine | Admitting: Emergency Medicine

## 2019-07-21 ENCOUNTER — Other Ambulatory Visit: Payer: Self-pay

## 2019-07-21 DIAGNOSIS — I10 Essential (primary) hypertension: Secondary | ICD-10-CM | POA: Diagnosis not present

## 2019-07-21 DIAGNOSIS — Z7982 Long term (current) use of aspirin: Secondary | ICD-10-CM | POA: Insufficient documentation

## 2019-07-21 DIAGNOSIS — Z20828 Contact with and (suspected) exposure to other viral communicable diseases: Secondary | ICD-10-CM | POA: Diagnosis not present

## 2019-07-21 DIAGNOSIS — R05 Cough: Secondary | ICD-10-CM | POA: Insufficient documentation

## 2019-07-21 DIAGNOSIS — J45909 Unspecified asthma, uncomplicated: Secondary | ICD-10-CM | POA: Diagnosis not present

## 2019-07-21 DIAGNOSIS — Z79899 Other long term (current) drug therapy: Secondary | ICD-10-CM | POA: Diagnosis not present

## 2019-07-21 DIAGNOSIS — R059 Cough, unspecified: Secondary | ICD-10-CM

## 2019-07-21 LAB — COMPREHENSIVE METABOLIC PANEL
ALT: 18 U/L (ref 0–44)
AST: 21 U/L (ref 15–41)
Albumin: 4.3 g/dL (ref 3.5–5.0)
Alkaline Phosphatase: 40 U/L (ref 38–126)
Anion gap: 10 (ref 5–15)
BUN: 13 mg/dL (ref 8–23)
CO2: 22 mmol/L (ref 22–32)
Calcium: 9.5 mg/dL (ref 8.9–10.3)
Chloride: 100 mmol/L (ref 98–111)
Creatinine, Ser: 0.82 mg/dL (ref 0.44–1.00)
GFR calc Af Amer: >60 mL/min (ref >60)
GFR calc non Af Amer: >60 mL/min (ref >60)
Glucose, Bld: 118 mg/dL — ABNORMAL HIGH (ref 70–99)
Potassium: 4 mmol/L (ref 3.5–5.1)
Sodium: 132 mmol/L — ABNORMAL LOW (ref 135–145)
Total Bilirubin: 1 mg/dL (ref 0.3–1.2)
Total Protein: 7.5 g/dL (ref 6.5–8.1)

## 2019-07-21 LAB — CBC WITH DIFFERENTIAL/PLATELET
Abs Immature Granulocytes: 0.02 10*3/uL (ref 0.00–0.07)
Basophils Absolute: 0 10*3/uL (ref 0.0–0.1)
Basophils Relative: 0 %
Eosinophils Absolute: 0.3 10*3/uL (ref 0.0–0.5)
Eosinophils Relative: 5 %
HCT: 42.9 % (ref 36.0–46.0)
Hemoglobin: 14.5 g/dL (ref 12.0–15.0)
Immature Granulocytes: 0 %
Lymphocytes Relative: 28 %
Lymphs Abs: 1.7 10*3/uL (ref 0.7–4.0)
MCH: 30.5 pg (ref 26.0–34.0)
MCHC: 33.8 g/dL (ref 30.0–36.0)
MCV: 90.3 fL (ref 80.0–100.0)
Monocytes Absolute: 0.7 10*3/uL (ref 0.1–1.0)
Monocytes Relative: 12 %
Neutro Abs: 3.2 10*3/uL (ref 1.7–7.7)
Neutrophils Relative %: 55 %
Platelets: 272 10*3/uL (ref 150–400)
RBC: 4.75 MIL/uL (ref 3.87–5.11)
RDW: 12.6 % (ref 11.5–15.5)
WBC: 5.9 10*3/uL (ref 4.0–10.5)
nRBC: 0 % (ref 0.0–0.2)

## 2019-07-21 MED ORDER — ALBUTEROL SULFATE HFA 108 (90 BASE) MCG/ACT IN AERS
2.0000 | INHALATION_SPRAY | Freq: Once | RESPIRATORY_TRACT | Status: AC
  Administered 2019-07-21: 14:00:00 2 via RESPIRATORY_TRACT
  Filled 2019-07-21: qty 6.7

## 2019-07-21 MED ORDER — PREDNISONE 20 MG PO TABS: 40.0000 mg | ORAL_TABLET | Freq: Every day | ORAL | 0 refills | Status: AC

## 2019-07-21 MED ORDER — AEROCHAMBER Z-STAT PLUS/MEDIUM MISC
1.0000 | Freq: Once | Status: AC
  Administered 2019-07-21: 1
  Filled 2019-07-21 (×2): qty 1

## 2019-07-21 MED ORDER — DOXYCYCLINE HYCLATE 100 MG PO CAPS: 100.0000 mg | ORAL_CAPSULE | Freq: Two times a day (BID) | ORAL | 0 refills | Status: AC

## 2019-07-21 NOTE — ED Provider Notes (Signed)
[facility] DEPT Provider Note   CSN: [id number] Arrival date & time: 07/21/19  1041     History   Chief Complaint Chief Complaint  Patient presents with  . Cough    HPI Ashley Cabrera is a 85 y.o. female with PMH asthma and GERD who presents to the ER with 2-week history of worsening cough productive with white sputum. She admits that she has been feeling weaker than usual and states that her cough has gotten progressively worse.  She uses her albuterol inhaler every day and has taken Mucinex, with some relief. She is unsure if she has had melena as she takes iron supplements.  She denies any fever, chills, dizziness, chest pain, or abdominal discomfort.  She denies any sick contacts.    HPI  Past Medical History:  Diagnosis Date  . Allergic rhinitis   . Arthritis   . Asthma    pt states very mild  . Back pain    d/t arthritis  . Breast lipoma    right  . Diverticulosis   . Gastric erosions   . GERD (gastroesophageal reflux disease)    takes Omeprazole daily  . H/O hiatal hernia   . History of bladder infections   . History of seasonal allergies   . Hx of colonic polyps   . Hypertension    takes HCTZ and Diltiazem daily  . Internal hemorrhoids   . Iron deficiency anemia    takes Iron pills daily  . Joint pain   . Joint swelling   . MVP (mitral valve prolapse)   . Osteopenia   . Peptic stricture of esophagus   . Peripheral edema    takes hctz daily  . Peripheral neuropathy   . Peripheral neuropathy   . Spinal stenosis     Patient Active Problem List   Diagnosis Date Noted  . Osteoarthritis of right knee 05/19/2012    Class: Diagnosis of    Past Surgical History:  Procedure Laterality Date  . BACK SURGERY    . BREAST EXCISIONAL BIOPSY  1975   Benign right breast  . COLONOSCOPY    . ESOPHAGOGASTRODUODENOSCOPY    . IRRIGATION AND DEBRIDEMENT SEBACEOUS CYST    . KNEE ARTHROPLASTY  05/20/2012   Procedure: COMPUTER  ASSISTED TOTAL KNEE ARTHROPLASTY;  Surgeon: Marybelle Killings, MD;  Location: [facility];  Service: Orthopedics;  Laterality: Right;  Right cemented total knee arthroplasty  . REPLACEMENT TOTAL KNEE     left     OB History   No obstetric history on file.      Home Medications    Prior to Admission medications   Medication Sig Start Date End Date Taking? Authorizing Provider  Ascorbic Acid (VITAMIN C) 1000 MG tablet Take 1,000 mg by mouth daily.    [provider]  aspirin 81 MG tablet Take 81 mg by mouth daily.    [provider]  Calcium Carbonate-Vitamin D (CALTRATE 600+D PO) Take 1 tablet by mouth daily.    [provider]  diclofenac sodium (VOLTAREN) 1 % GEL Apply 1 application topically every 6 (six) hours as needed. For pain    [provider]  diltiazem (DILACOR XR) 240 MG 24 hr capsule Take 240 mg by mouth daily.    [provider]  doxycycline (VIBRAMYCIN) 100 MG capsule Take 1 capsule (100 mg total) by mouth 2 (two) times daily. 07/21/19   Corena Herter, PA-C  ferrous sulfate 325 (65 FE) MG  tablet Take 325 mg by mouth daily with breakfast.    [provider]  hydrochlorothiazide (MICROZIDE) 12.5 MG capsule Take 12.5 mg by mouth daily.    [provider]  Multiple Vitamin (MULTIVITAMIN) tablet Take 1 tablet by mouth daily.    [provider]  OMEGA-3 KRILL OIL PO Take 1 tablet by mouth daily.    [provider]  omeprazole (PRILOSEC) 20 MG capsule Take 20 mg by mouth daily.    [provider]  predniSONE (DELTASONE) 20 MG tablet Take 2 tablets (40 mg total) by mouth daily with breakfast for 5 days. 07/21/19 07/26/19  Corena Herter, PA-C  pregabalin (LYRICA) 25 MG capsule Take 25 mg by mouth 3 (three) times daily.     [provider]    Family History Family History  Problem Relation Age of Onset  . Hypertension Father   . Lymphoma Mother   . Dementia Brother   . Hypertension  Brother   . Coronary artery disease Brother   . Hypertension Sister     Social History Social History   Tobacco Use  . Smoking status: Never Smoker  . Smokeless tobacco: Never Used  Substance Use Topics  . Alcohol use: No  . Drug use: No     Allergies   Vicodin [hydrocodone-acetaminophen]   Review of Systems Review of Systems  All other systems reviewed and are negative.    Physical Exam Updated Vital Signs BP (!) 145/85 (BP Location: Right Arm)   Pulse 80   Temp 98.1 F (36.7 C) (Oral)   Resp 18   Ht 5' 3.25" (1.607 m)   Wt 72.6 kg   SpO2 96%   BMI 28.12 kg/m   Physical Exam Vitals signs and nursing note reviewed. Exam conducted with a chaperone present.  Constitutional:      Appearance: Normal appearance.  HENT:     Head: Normocephalic and atraumatic.  Eyes:     General: No scleral icterus.    Conjunctiva/sclera: Conjunctivae normal.  Cardiovascular:     Rate and Rhythm: Normal rate and regular rhythm.     Pulses: Normal pulses.     Heart sounds: Normal heart sounds.  Pulmonary:     Effort: Pulmonary effort is normal. No respiratory distress.     Breath sounds: Wheezing and rales present.  Abdominal:     General: Bowel sounds are normal. There is no distension.     Palpations: Abdomen is soft.     Tenderness: There is no abdominal tenderness. There is no guarding.  Musculoskeletal:     Right lower leg: No edema.     Left lower leg: No edema.  Skin:    General: Skin is dry.  Neurological:     Mental Status: She is alert.     GCS: GCS eye subscore is 4. GCS verbal subscore is 5. GCS motor subscore is 6.  Psychiatric:        Mood and Affect: Mood normal.        Behavior: Behavior normal.        Thought Content: Thought content normal.      ED Treatments / Results  Labs (all labs ordered are listed, but only abnormal results are displayed) Labs Reviewed  COMPREHENSIVE METABOLIC PANEL - Abnormal; Notable for the following components:       Result Value   Sodium 132 (*)    Glucose, Bld 118 (*)    All other components within normal limits  NOVEL CORONAVIRUS,  NAA (HOSP ORDER, SEND-OUT TO REF LAB; TAT 18-24 HRS)  CBC WITH DIFFERENTIAL/PLATELET    EKG None  Radiology Dg Chest Port 1 View  Result Date: 07/21/2019 CLINICAL DATA:  Cough, worsening. EXAM: PORTABLE CHEST 1 VIEW COMPARISON:  03/25/2015 FINDINGS: There is added density in the retrocardiac region with rounded contour. No clear air-fluid level. No signs of parenchymal consolidation or pleural effusion. No acute bone finding. IMPRESSION: Suspect large hiatal hernia, perhaps fluid-filled. This cannot be confirmed on single-view in the absence of air-fluid levels. Suggest lateral view of the chest for further assessment to exclude mass or other process in this location. Electronically Signed   By: Zetta Bills M.D.   On: 07/21/2019 13:07    Procedures Procedures (including critical care time)  Medications Ordered in ED Medications  albuterol (VENTOLIN HFA) 108 (90 Base) MCG/ACT inhaler 2 puff (has no administration in time range)  aerochamber Z-Stat Plus/medium 1 each (has no administration in time range)     Initial Impression / Assessment and Plan / ED Course  I have reviewed the triage vital signs and the nursing notes.  Pertinent labs & imaging results that were available during my care of the patient were reviewed by me and considered in my medical decision making (see chart for details).        Her CBC was reassuring and there is no evidence to suggest anemia or infection.  Evidently, she was primarily concerned for COVID-19 infection so we will test her and she will have results within the next 2-3 days.  Her chest x-ray was reassuring and does not demonstrate any consolidation concerning for pneumonia.  Given her symptoms however in addition to her age and history of pulmonary disease, will treat her with doxycycline and prednisone burst.  All of the  evaluation and work-up results were discussed with the patient at bedside. They were provided opportunity to ask any additional questions and have none at this time. They have expressed understanding of verbal discharge instructions as well as return precautions and are agreeable to the plan.   Final Clinical Impressions(s) / ED Diagnoses   Final diagnoses:  Cough    ED Discharge Orders         Ordered    doxycycline (VIBRAMYCIN) 100 MG capsule  2 times daily     07/21/19 1339    predniSONE (DELTASONE) 20 MG tablet  Daily with breakfast     07/21/19 1339           Reita Chard 07/21/19 1354    Isla Pence, MD 07/21/19 2017

## 2019-07-21 NOTE — ED Provider Notes (Signed)
85 y/o F who presents to the ED today for eval of cough for 2 weeks. Cough has been productive of sputum that is white. She has been getting somewhat short of breath with her activities at home. She denies any chest pain or BLE swelling. Denies fevers or other URI sxs.   Rhonchi and wheezing noted on exam. No hypoxia or tachycardia noted. No BLE edema or calf tenderness.   Reviewed labs, CBC is without leukocytosis or anemia CMP nonacute  CXR without pneumonia or other acute abnormality.   She was given albuterol in the ed and will be sent home with prednisone and doxy. She was tested for covid   Rodney Booze, PA-C 07/21/19 1742    Isla Pence, MD 07/21/19 2018

## 2019-07-21 NOTE — ED Triage Notes (Signed)
Patient c/o a productive cough with white sputum x 2 weeks. Patient states worse in the past 2-3 days. Patient denies any other symptoms.

## 2019-07-21 NOTE — ED Notes (Signed)
ED Provider at bedside. 

## 2019-07-21 NOTE — Discharge Instructions (Signed)
Please take medications, as prescribed. Please also read the attachment provided.   Return to ED or seek medical attention should you develop any fevers, chills, worsening fatigue, or other new or worsening symptoms. Your COVID-19 test results should be available in the next 2-3 days.

## 2019-07-21 NOTE — ED Notes (Signed)
XR at bedside

## 2019-07-22 LAB — NOVEL CORONAVIRUS, NAA (HOSP ORDER, SEND-OUT TO REF LAB; TAT 18-24 HRS): SARS-CoV-2, NAA: NOT DETECTED

## 2019-08-04 DIAGNOSIS — I10 Essential (primary) hypertension: Secondary | ICD-10-CM | POA: Diagnosis not present

## 2019-08-04 DIAGNOSIS — J4541 Moderate persistent asthma with (acute) exacerbation: Secondary | ICD-10-CM | POA: Diagnosis not present

## 2019-08-04 DIAGNOSIS — Z1389 Encounter for screening for other disorder: Secondary | ICD-10-CM | POA: Diagnosis not present

## 2019-08-04 DIAGNOSIS — Z0000 Encounter for general adult medical examination without abnormal findings: Secondary | ICD-10-CM | POA: Diagnosis not present

## 2019-08-04 DIAGNOSIS — G629 Polyneuropathy, unspecified: Secondary | ICD-10-CM | POA: Diagnosis not present

## 2019-08-04 DIAGNOSIS — Z79899 Other long term (current) drug therapy: Secondary | ICD-10-CM | POA: Diagnosis not present

## 2019-08-04 DIAGNOSIS — Z23 Encounter for immunization: Secondary | ICD-10-CM | POA: Diagnosis not present

## 2019-08-04 DIAGNOSIS — K219 Gastro-esophageal reflux disease without esophagitis: Secondary | ICD-10-CM | POA: Diagnosis not present

## 2019-08-14 DIAGNOSIS — M810 Age-related osteoporosis without current pathological fracture: Secondary | ICD-10-CM | POA: Diagnosis not present

## 2019-08-14 DIAGNOSIS — J4540 Moderate persistent asthma, uncomplicated: Secondary | ICD-10-CM | POA: Diagnosis not present

## 2019-08-14 DIAGNOSIS — J4541 Moderate persistent asthma with (acute) exacerbation: Secondary | ICD-10-CM | POA: Diagnosis not present

## 2019-08-14 DIAGNOSIS — I10 Essential (primary) hypertension: Secondary | ICD-10-CM | POA: Diagnosis not present

## 2019-09-18 DIAGNOSIS — I10 Essential (primary) hypertension: Secondary | ICD-10-CM | POA: Diagnosis not present

## 2019-09-18 DIAGNOSIS — J4540 Moderate persistent asthma, uncomplicated: Secondary | ICD-10-CM | POA: Diagnosis not present

## 2019-09-18 DIAGNOSIS — J4541 Moderate persistent asthma with (acute) exacerbation: Secondary | ICD-10-CM | POA: Diagnosis not present

## 2019-09-18 DIAGNOSIS — M810 Age-related osteoporosis without current pathological fracture: Secondary | ICD-10-CM | POA: Diagnosis not present

## 2019-10-10 DIAGNOSIS — I10 Essential (primary) hypertension: Secondary | ICD-10-CM | POA: Diagnosis not present

## 2019-10-10 DIAGNOSIS — J4541 Moderate persistent asthma with (acute) exacerbation: Secondary | ICD-10-CM | POA: Diagnosis not present

## 2019-10-10 DIAGNOSIS — M810 Age-related osteoporosis without current pathological fracture: Secondary | ICD-10-CM | POA: Diagnosis not present

## 2019-10-10 DIAGNOSIS — J4540 Moderate persistent asthma, uncomplicated: Secondary | ICD-10-CM | POA: Diagnosis not present

## 2019-11-13 DIAGNOSIS — J4540 Moderate persistent asthma, uncomplicated: Secondary | ICD-10-CM | POA: Diagnosis not present

## 2019-11-13 DIAGNOSIS — J4541 Moderate persistent asthma with (acute) exacerbation: Secondary | ICD-10-CM | POA: Diagnosis not present

## 2019-11-13 DIAGNOSIS — I10 Essential (primary) hypertension: Secondary | ICD-10-CM | POA: Diagnosis not present

## 2019-11-13 DIAGNOSIS — M810 Age-related osteoporosis without current pathological fracture: Secondary | ICD-10-CM | POA: Diagnosis not present

## 2019-11-20 ENCOUNTER — Other Ambulatory Visit: Payer: Self-pay | Admitting: Geriatric Medicine

## 2019-11-20 DIAGNOSIS — Z1231 Encounter for screening mammogram for malignant neoplasm of breast: Secondary | ICD-10-CM

## 2019-12-13 DIAGNOSIS — I10 Essential (primary) hypertension: Secondary | ICD-10-CM | POA: Diagnosis not present

## 2019-12-13 DIAGNOSIS — J4540 Moderate persistent asthma, uncomplicated: Secondary | ICD-10-CM | POA: Diagnosis not present

## 2019-12-13 DIAGNOSIS — M810 Age-related osteoporosis without current pathological fracture: Secondary | ICD-10-CM | POA: Diagnosis not present

## 2019-12-13 DIAGNOSIS — J4541 Moderate persistent asthma with (acute) exacerbation: Secondary | ICD-10-CM | POA: Diagnosis not present

## 2019-12-26 DIAGNOSIS — Z961 Presence of intraocular lens: Secondary | ICD-10-CM | POA: Diagnosis not present

## 2019-12-26 DIAGNOSIS — H04123 Dry eye syndrome of bilateral lacrimal glands: Secondary | ICD-10-CM | POA: Diagnosis not present

## 2019-12-26 DIAGNOSIS — H3562 Retinal hemorrhage, left eye: Secondary | ICD-10-CM | POA: Diagnosis not present

## 2019-12-26 DIAGNOSIS — H524 Presbyopia: Secondary | ICD-10-CM | POA: Diagnosis not present

## 2019-12-26 DIAGNOSIS — H35033 Hypertensive retinopathy, bilateral: Secondary | ICD-10-CM | POA: Diagnosis not present

## 2019-12-29 ENCOUNTER — Ambulatory Visit: Payer: PPO

## 2020-01-02 DIAGNOSIS — M810 Age-related osteoporosis without current pathological fracture: Secondary | ICD-10-CM | POA: Diagnosis not present

## 2020-01-12 DIAGNOSIS — J4541 Moderate persistent asthma with (acute) exacerbation: Secondary | ICD-10-CM | POA: Diagnosis not present

## 2020-01-12 DIAGNOSIS — M810 Age-related osteoporosis without current pathological fracture: Secondary | ICD-10-CM | POA: Diagnosis not present

## 2020-01-12 DIAGNOSIS — I10 Essential (primary) hypertension: Secondary | ICD-10-CM | POA: Diagnosis not present

## 2020-01-12 DIAGNOSIS — J4540 Moderate persistent asthma, uncomplicated: Secondary | ICD-10-CM | POA: Diagnosis not present

## 2020-01-24 ENCOUNTER — Other Ambulatory Visit: Payer: Self-pay

## 2020-01-24 ENCOUNTER — Ambulatory Visit
Admission: RE | Admit: 2020-01-24 | Discharge: 2020-01-24 | Disposition: A | Discharge status: Home / Self Care | Payer: PPO | Source: Ambulatory Visit | Attending: Geriatric Medicine | Admitting: Geriatric Medicine

## 2020-01-24 DIAGNOSIS — Z1231 Encounter for screening mammogram for malignant neoplasm of breast: Secondary | ICD-10-CM | POA: Diagnosis not present

## 2020-02-06 DIAGNOSIS — I10 Essential (primary) hypertension: Secondary | ICD-10-CM | POA: Diagnosis not present

## 2020-02-06 DIAGNOSIS — J4540 Moderate persistent asthma, uncomplicated: Secondary | ICD-10-CM | POA: Diagnosis not present

## 2020-02-06 DIAGNOSIS — J3089 Other allergic rhinitis: Secondary | ICD-10-CM | POA: Diagnosis not present

## 2020-02-06 DIAGNOSIS — Z79899 Other long term (current) drug therapy: Secondary | ICD-10-CM | POA: Diagnosis not present

## 2020-02-12 DIAGNOSIS — J4540 Moderate persistent asthma, uncomplicated: Secondary | ICD-10-CM | POA: Diagnosis not present

## 2020-02-12 DIAGNOSIS — J4541 Moderate persistent asthma with (acute) exacerbation: Secondary | ICD-10-CM | POA: Diagnosis not present

## 2020-02-12 DIAGNOSIS — I10 Essential (primary) hypertension: Secondary | ICD-10-CM | POA: Diagnosis not present

## 2020-02-12 DIAGNOSIS — M810 Age-related osteoporosis without current pathological fracture: Secondary | ICD-10-CM | POA: Diagnosis not present

## 2020-03-12 DIAGNOSIS — J4540 Moderate persistent asthma, uncomplicated: Secondary | ICD-10-CM | POA: Diagnosis not present

## 2020-03-12 DIAGNOSIS — I10 Essential (primary) hypertension: Secondary | ICD-10-CM | POA: Diagnosis not present

## 2020-03-12 DIAGNOSIS — M810 Age-related osteoporosis without current pathological fracture: Secondary | ICD-10-CM | POA: Diagnosis not present

## 2020-03-12 DIAGNOSIS — J4541 Moderate persistent asthma with (acute) exacerbation: Secondary | ICD-10-CM | POA: Diagnosis not present

## 2020-04-06 DIAGNOSIS — J4540 Moderate persistent asthma, uncomplicated: Secondary | ICD-10-CM | POA: Diagnosis not present

## 2020-04-06 DIAGNOSIS — M810 Age-related osteoporosis without current pathological fracture: Secondary | ICD-10-CM | POA: Diagnosis not present

## 2020-04-06 DIAGNOSIS — I10 Essential (primary) hypertension: Secondary | ICD-10-CM | POA: Diagnosis not present

## 2020-04-06 DIAGNOSIS — J4541 Moderate persistent asthma with (acute) exacerbation: Secondary | ICD-10-CM | POA: Diagnosis not present

## 2020-05-07 DIAGNOSIS — H524 Presbyopia: Secondary | ICD-10-CM | POA: Diagnosis not present

## 2020-05-07 DIAGNOSIS — H3562 Retinal hemorrhage, left eye: Secondary | ICD-10-CM | POA: Diagnosis not present

## 2020-05-07 DIAGNOSIS — H35033 Hypertensive retinopathy, bilateral: Secondary | ICD-10-CM | POA: Diagnosis not present

## 2020-05-10 DIAGNOSIS — J4540 Moderate persistent asthma, uncomplicated: Secondary | ICD-10-CM | POA: Diagnosis not present

## 2020-05-10 DIAGNOSIS — J4541 Moderate persistent asthma with (acute) exacerbation: Secondary | ICD-10-CM | POA: Diagnosis not present

## 2020-05-10 DIAGNOSIS — I10 Essential (primary) hypertension: Secondary | ICD-10-CM | POA: Diagnosis not present

## 2020-05-10 DIAGNOSIS — M810 Age-related osteoporosis without current pathological fracture: Secondary | ICD-10-CM | POA: Diagnosis not present

## 2020-06-10 DIAGNOSIS — J4540 Moderate persistent asthma, uncomplicated: Secondary | ICD-10-CM | POA: Diagnosis not present

## 2020-06-10 DIAGNOSIS — I10 Essential (primary) hypertension: Secondary | ICD-10-CM | POA: Diagnosis not present

## 2020-06-10 DIAGNOSIS — M810 Age-related osteoporosis without current pathological fracture: Secondary | ICD-10-CM | POA: Diagnosis not present

## 2020-06-10 DIAGNOSIS — J4541 Moderate persistent asthma with (acute) exacerbation: Secondary | ICD-10-CM | POA: Diagnosis not present

## 2020-07-11 DIAGNOSIS — M810 Age-related osteoporosis without current pathological fracture: Secondary | ICD-10-CM | POA: Diagnosis not present

## 2020-07-12 DIAGNOSIS — I10 Essential (primary) hypertension: Secondary | ICD-10-CM | POA: Diagnosis not present

## 2020-07-12 DIAGNOSIS — J4541 Moderate persistent asthma with (acute) exacerbation: Secondary | ICD-10-CM | POA: Diagnosis not present

## 2020-07-12 DIAGNOSIS — J4540 Moderate persistent asthma, uncomplicated: Secondary | ICD-10-CM | POA: Diagnosis not present

## 2020-07-12 DIAGNOSIS — M810 Age-related osteoporosis without current pathological fracture: Secondary | ICD-10-CM | POA: Diagnosis not present

## 2020-07-17 DIAGNOSIS — Z23 Encounter for immunization: Secondary | ICD-10-CM | POA: Diagnosis not present

## 2020-07-31 IMAGING — CR DG SINUSES 1-2V
2 series · 2 of 2 positions shown · non-contrast
Comparison: Head CT scan 09/14/2006.

CLINICAL DATA: Nasal congestion for 6 months.

EXAM:
PARANASAL SINUSES - 1-2 VIEW

[w waters *]
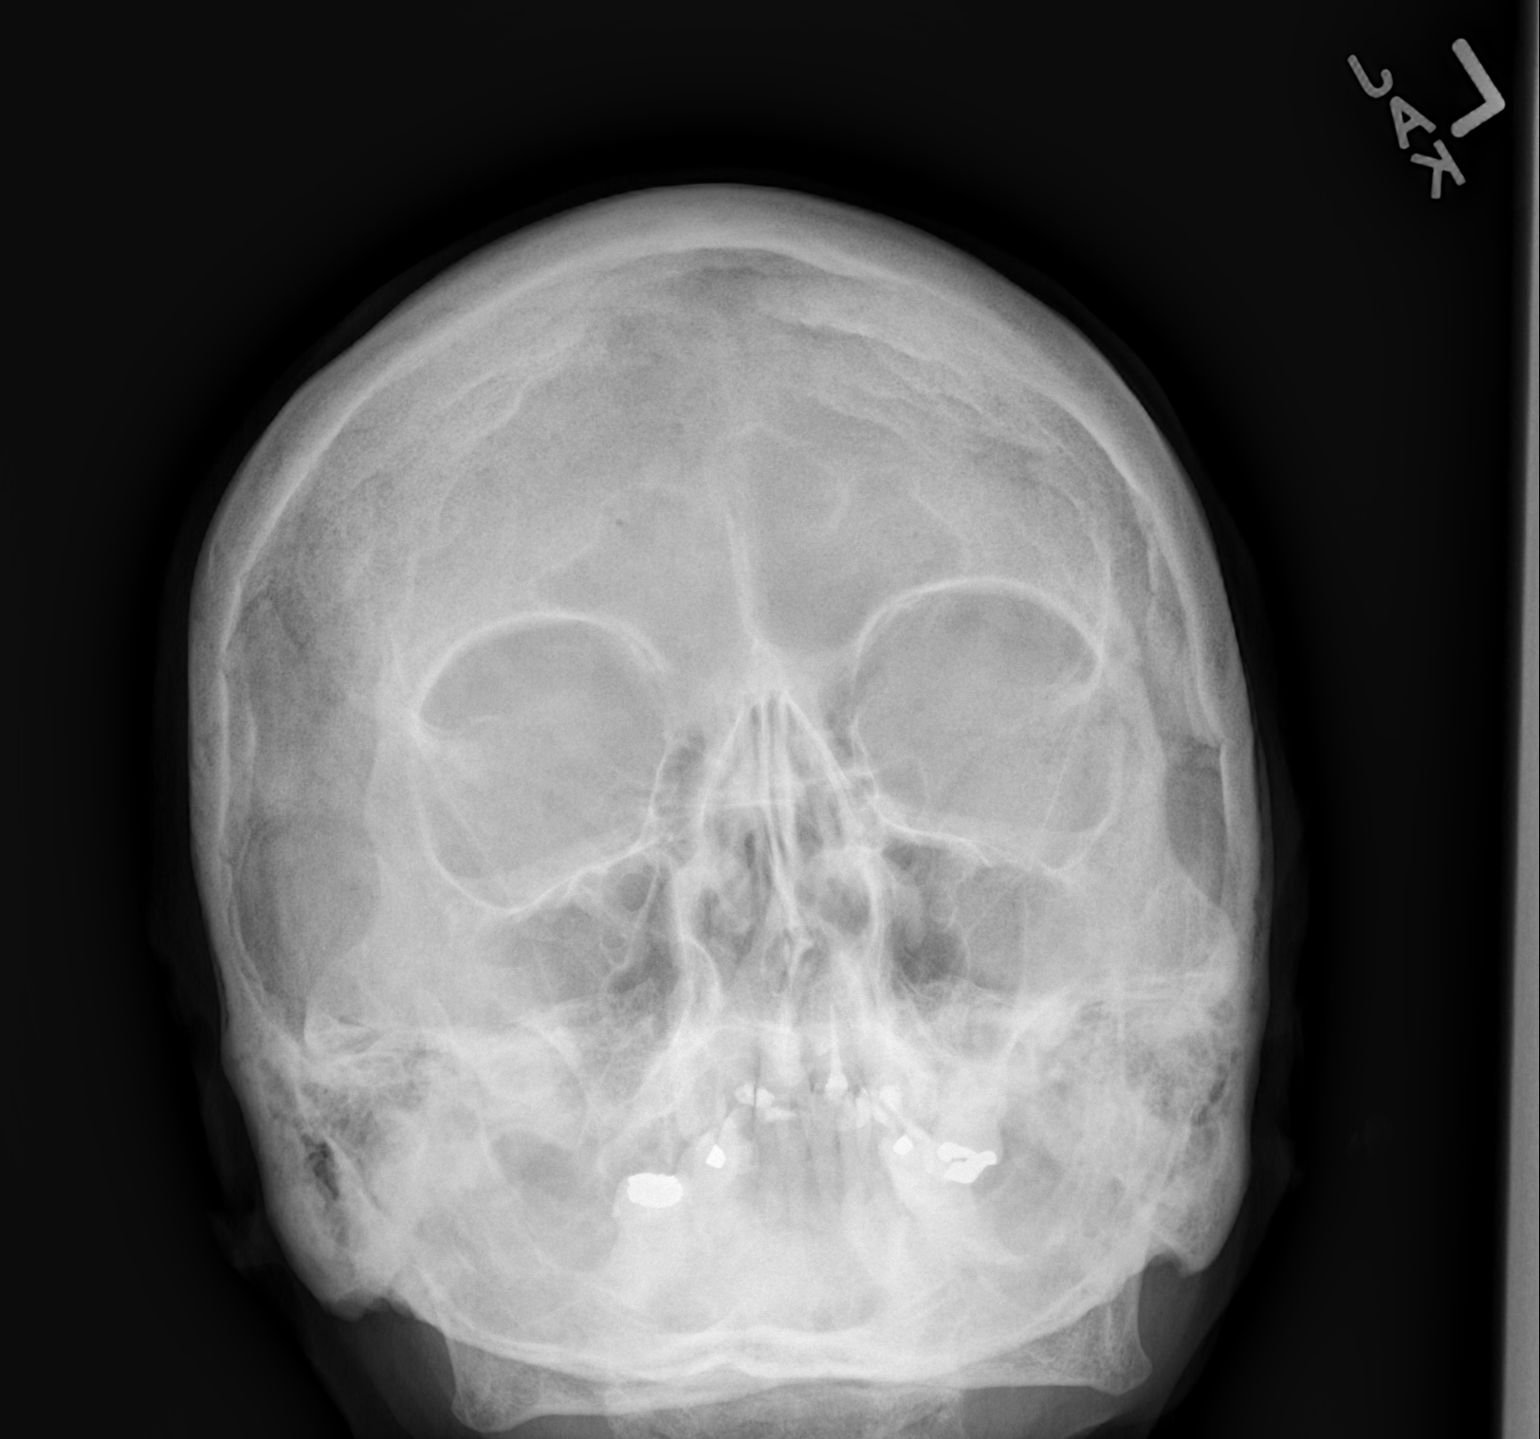

[w skull lat *]
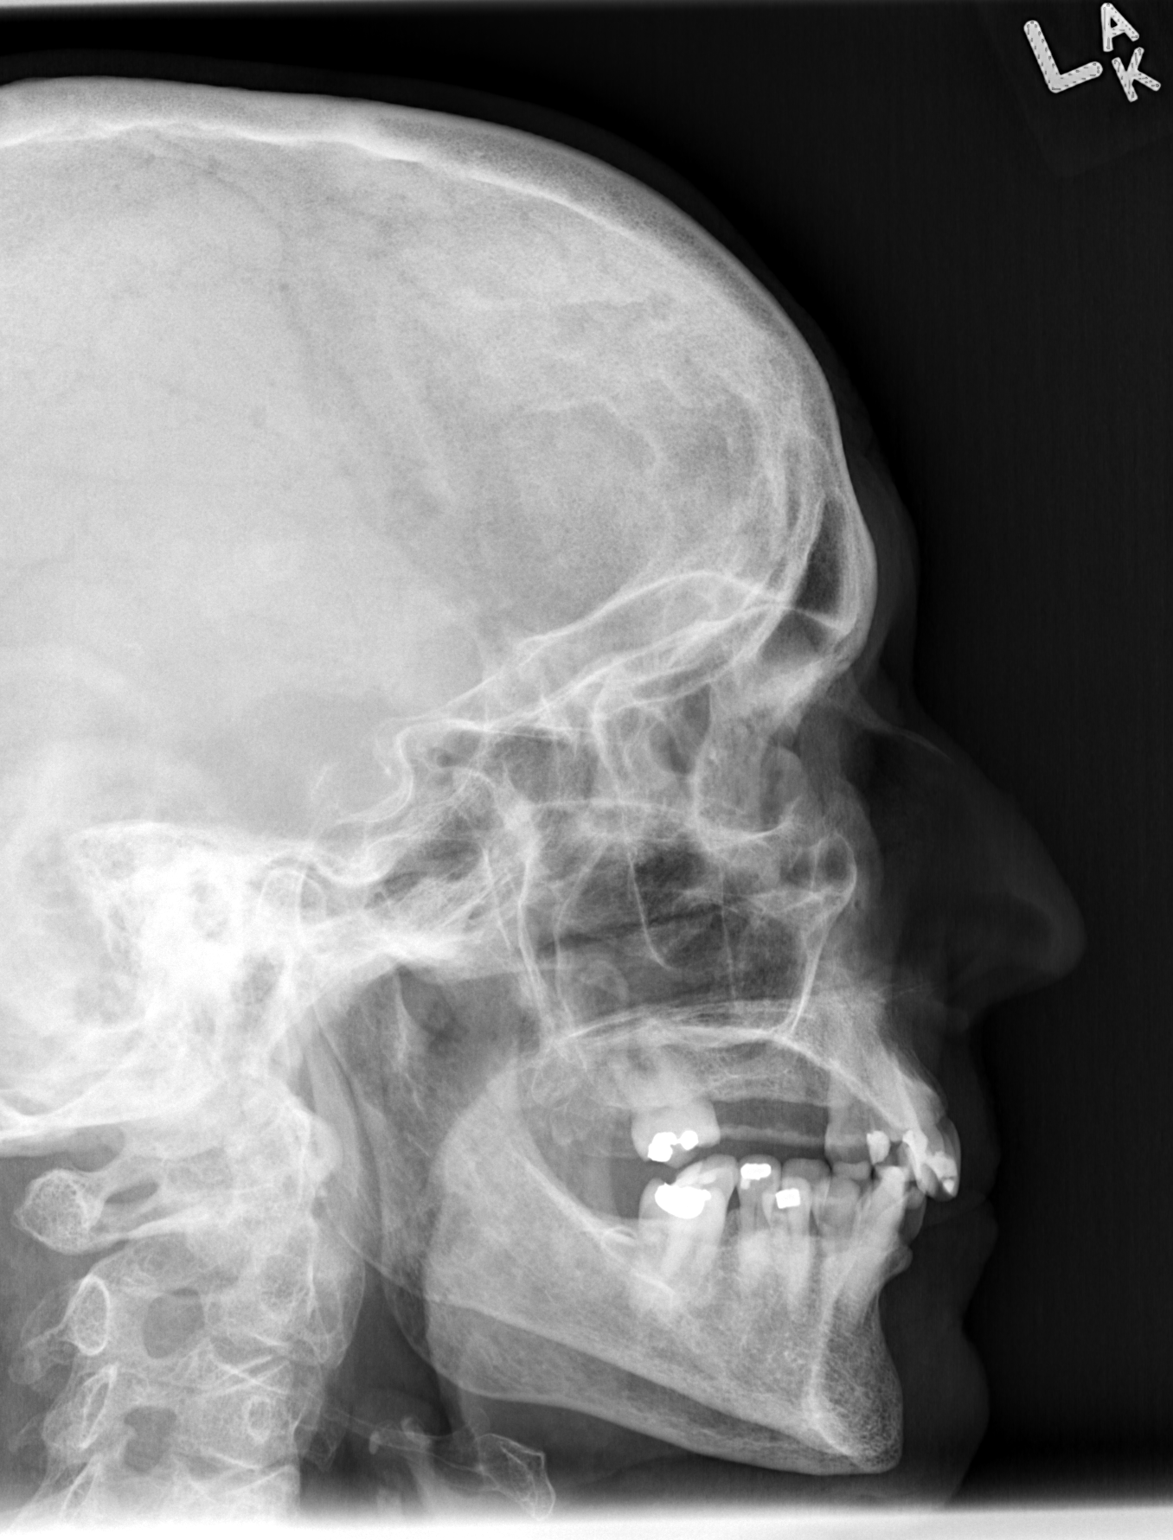

[2 of 2 positions shown; findings below may reference images not displayed]

FINDINGS: The paranasal sinus are aerated. There is no evidence of sinus
opacification air-fluid levels or mucosal thickening. No significant
bone abnormalities are seen.
IMPRESSION: Negative exam.

## 2020-08-05 DIAGNOSIS — I10 Essential (primary) hypertension: Secondary | ICD-10-CM | POA: Diagnosis not present

## 2020-08-05 DIAGNOSIS — J4540 Moderate persistent asthma, uncomplicated: Secondary | ICD-10-CM | POA: Diagnosis not present

## 2020-08-05 DIAGNOSIS — M810 Age-related osteoporosis without current pathological fracture: Secondary | ICD-10-CM | POA: Diagnosis not present

## 2020-08-19 DIAGNOSIS — G2581 Restless legs syndrome: Secondary | ICD-10-CM | POA: Diagnosis not present

## 2020-08-19 DIAGNOSIS — M810 Age-related osteoporosis without current pathological fracture: Secondary | ICD-10-CM | POA: Diagnosis not present

## 2020-08-19 DIAGNOSIS — K219 Gastro-esophageal reflux disease without esophagitis: Secondary | ICD-10-CM | POA: Diagnosis not present

## 2020-08-19 DIAGNOSIS — J4540 Moderate persistent asthma, uncomplicated: Secondary | ICD-10-CM | POA: Diagnosis not present

## 2020-08-19 DIAGNOSIS — M48061 Spinal stenosis, lumbar region without neurogenic claudication: Secondary | ICD-10-CM | POA: Diagnosis not present

## 2020-08-19 DIAGNOSIS — I10 Essential (primary) hypertension: Secondary | ICD-10-CM | POA: Diagnosis not present

## 2020-08-19 DIAGNOSIS — G629 Polyneuropathy, unspecified: Secondary | ICD-10-CM | POA: Diagnosis not present

## 2020-08-19 DIAGNOSIS — Z0000 Encounter for general adult medical examination without abnormal findings: Secondary | ICD-10-CM | POA: Diagnosis not present

## 2020-08-19 DIAGNOSIS — Z79899 Other long term (current) drug therapy: Secondary | ICD-10-CM | POA: Diagnosis not present

## 2020-09-04 DIAGNOSIS — M810 Age-related osteoporosis without current pathological fracture: Secondary | ICD-10-CM | POA: Diagnosis not present

## 2020-09-04 DIAGNOSIS — K219 Gastro-esophageal reflux disease without esophagitis: Secondary | ICD-10-CM | POA: Diagnosis not present

## 2020-09-04 DIAGNOSIS — J4540 Moderate persistent asthma, uncomplicated: Secondary | ICD-10-CM | POA: Diagnosis not present

## 2020-09-04 DIAGNOSIS — I10 Essential (primary) hypertension: Secondary | ICD-10-CM | POA: Diagnosis not present

## 2020-10-01 DIAGNOSIS — M810 Age-related osteoporosis without current pathological fracture: Secondary | ICD-10-CM | POA: Diagnosis not present

## 2020-10-01 DIAGNOSIS — J4540 Moderate persistent asthma, uncomplicated: Secondary | ICD-10-CM | POA: Diagnosis not present

## 2020-10-01 DIAGNOSIS — K219 Gastro-esophageal reflux disease without esophagitis: Secondary | ICD-10-CM | POA: Diagnosis not present

## 2020-10-01 DIAGNOSIS — I10 Essential (primary) hypertension: Secondary | ICD-10-CM | POA: Diagnosis not present

## 2020-11-01 DIAGNOSIS — M810 Age-related osteoporosis without current pathological fracture: Secondary | ICD-10-CM | POA: Diagnosis not present

## 2020-11-01 DIAGNOSIS — K219 Gastro-esophageal reflux disease without esophagitis: Secondary | ICD-10-CM | POA: Diagnosis not present

## 2020-11-01 DIAGNOSIS — I10 Essential (primary) hypertension: Secondary | ICD-10-CM | POA: Diagnosis not present

## 2020-11-01 DIAGNOSIS — J4540 Moderate persistent asthma, uncomplicated: Secondary | ICD-10-CM | POA: Diagnosis not present

## 2020-12-02 DIAGNOSIS — I10 Essential (primary) hypertension: Secondary | ICD-10-CM | POA: Diagnosis not present

## 2020-12-02 DIAGNOSIS — K219 Gastro-esophageal reflux disease without esophagitis: Secondary | ICD-10-CM | POA: Diagnosis not present

## 2020-12-02 DIAGNOSIS — M810 Age-related osteoporosis without current pathological fracture: Secondary | ICD-10-CM | POA: Diagnosis not present

## 2020-12-02 DIAGNOSIS — J4540 Moderate persistent asthma, uncomplicated: Secondary | ICD-10-CM | POA: Diagnosis not present

## 2020-12-30 DIAGNOSIS — J4540 Moderate persistent asthma, uncomplicated: Secondary | ICD-10-CM | POA: Diagnosis not present

## 2020-12-30 DIAGNOSIS — I10 Essential (primary) hypertension: Secondary | ICD-10-CM | POA: Diagnosis not present

## 2020-12-30 DIAGNOSIS — M810 Age-related osteoporosis without current pathological fracture: Secondary | ICD-10-CM | POA: Diagnosis not present

## 2020-12-30 DIAGNOSIS — K219 Gastro-esophageal reflux disease without esophagitis: Secondary | ICD-10-CM | POA: Diagnosis not present

## 2021-01-01 DIAGNOSIS — M810 Age-related osteoporosis without current pathological fracture: Secondary | ICD-10-CM | POA: Diagnosis not present

## 2021-01-21 ENCOUNTER — Other Ambulatory Visit: Payer: Self-pay | Admitting: Geriatric Medicine

## 2021-01-21 DIAGNOSIS — Z1231 Encounter for screening mammogram for malignant neoplasm of breast: Secondary | ICD-10-CM

## 2021-01-31 DIAGNOSIS — I10 Essential (primary) hypertension: Secondary | ICD-10-CM | POA: Diagnosis not present

## 2021-01-31 DIAGNOSIS — J4540 Moderate persistent asthma, uncomplicated: Secondary | ICD-10-CM | POA: Diagnosis not present

## 2021-01-31 DIAGNOSIS — K219 Gastro-esophageal reflux disease without esophagitis: Secondary | ICD-10-CM | POA: Diagnosis not present

## 2021-01-31 DIAGNOSIS — G8929 Other chronic pain: Secondary | ICD-10-CM | POA: Diagnosis not present

## 2021-01-31 DIAGNOSIS — M810 Age-related osteoporosis without current pathological fracture: Secondary | ICD-10-CM | POA: Diagnosis not present

## 2021-02-20 DIAGNOSIS — I10 Essential (primary) hypertension: Secondary | ICD-10-CM | POA: Diagnosis not present

## 2021-02-20 DIAGNOSIS — J301 Allergic rhinitis due to pollen: Secondary | ICD-10-CM | POA: Diagnosis not present

## 2021-02-20 DIAGNOSIS — G8929 Other chronic pain: Secondary | ICD-10-CM | POA: Diagnosis not present

## 2021-02-20 DIAGNOSIS — J4540 Moderate persistent asthma, uncomplicated: Secondary | ICD-10-CM | POA: Diagnosis not present

## 2021-03-03 DIAGNOSIS — G8929 Other chronic pain: Secondary | ICD-10-CM | POA: Diagnosis not present

## 2021-03-03 DIAGNOSIS — K219 Gastro-esophageal reflux disease without esophagitis: Secondary | ICD-10-CM | POA: Diagnosis not present

## 2021-03-03 DIAGNOSIS — M810 Age-related osteoporosis without current pathological fracture: Secondary | ICD-10-CM | POA: Diagnosis not present

## 2021-03-03 DIAGNOSIS — J4540 Moderate persistent asthma, uncomplicated: Secondary | ICD-10-CM | POA: Diagnosis not present

## 2021-03-03 DIAGNOSIS — I10 Essential (primary) hypertension: Secondary | ICD-10-CM | POA: Diagnosis not present

## 2021-03-13 ENCOUNTER — Ambulatory Visit
Admission: RE | Admit: 2021-03-13 | Discharge: 2021-03-13 | Disposition: A | Discharge status: Home / Self Care | Payer: Medicare Other | Source: Ambulatory Visit | Attending: Geriatric Medicine | Admitting: Geriatric Medicine

## 2021-03-13 ENCOUNTER — Other Ambulatory Visit: Payer: Self-pay

## 2021-03-13 DIAGNOSIS — Z1231 Encounter for screening mammogram for malignant neoplasm of breast: Secondary | ICD-10-CM | POA: Diagnosis not present

## 2021-04-02 DIAGNOSIS — J4540 Moderate persistent asthma, uncomplicated: Secondary | ICD-10-CM | POA: Diagnosis not present

## 2021-04-02 DIAGNOSIS — K219 Gastro-esophageal reflux disease without esophagitis: Secondary | ICD-10-CM | POA: Diagnosis not present

## 2021-04-02 DIAGNOSIS — I10 Essential (primary) hypertension: Secondary | ICD-10-CM | POA: Diagnosis not present

## 2021-04-02 DIAGNOSIS — G8929 Other chronic pain: Secondary | ICD-10-CM | POA: Diagnosis not present

## 2021-04-02 DIAGNOSIS — M810 Age-related osteoporosis without current pathological fracture: Secondary | ICD-10-CM | POA: Diagnosis not present

## 2021-05-06 DIAGNOSIS — H524 Presbyopia: Secondary | ICD-10-CM | POA: Diagnosis not present

## 2021-05-06 DIAGNOSIS — H35033 Hypertensive retinopathy, bilateral: Secondary | ICD-10-CM | POA: Diagnosis not present

## 2021-05-28 DIAGNOSIS — R051 Acute cough: Secondary | ICD-10-CM | POA: Diagnosis not present

## 2021-05-28 DIAGNOSIS — Z03818 Encounter for observation for suspected exposure to other biological agents ruled out: Secondary | ICD-10-CM | POA: Diagnosis not present

## 2021-07-10 DIAGNOSIS — M810 Age-related osteoporosis without current pathological fracture: Secondary | ICD-10-CM | POA: Diagnosis not present

## 2021-08-25 DIAGNOSIS — Z0000 Encounter for general adult medical examination without abnormal findings: Secondary | ICD-10-CM | POA: Diagnosis not present

## 2021-08-25 DIAGNOSIS — M810 Age-related osteoporosis without current pathological fracture: Secondary | ICD-10-CM | POA: Diagnosis not present

## 2021-08-25 DIAGNOSIS — K9089 Other intestinal malabsorption: Secondary | ICD-10-CM | POA: Diagnosis not present

## 2021-08-25 DIAGNOSIS — Z23 Encounter for immunization: Secondary | ICD-10-CM | POA: Diagnosis not present

## 2021-08-25 DIAGNOSIS — J4540 Moderate persistent asthma, uncomplicated: Secondary | ICD-10-CM | POA: Diagnosis not present

## 2021-08-25 DIAGNOSIS — G629 Polyneuropathy, unspecified: Secondary | ICD-10-CM | POA: Diagnosis not present

## 2021-08-25 DIAGNOSIS — I10 Essential (primary) hypertension: Secondary | ICD-10-CM | POA: Diagnosis not present

## 2021-08-25 DIAGNOSIS — Z79899 Other long term (current) drug therapy: Secondary | ICD-10-CM | POA: Diagnosis not present

## 2021-08-25 DIAGNOSIS — Z1389 Encounter for screening for other disorder: Secondary | ICD-10-CM | POA: Diagnosis not present

## 2021-08-25 DIAGNOSIS — K219 Gastro-esophageal reflux disease without esophagitis: Secondary | ICD-10-CM | POA: Diagnosis not present

## 2021-12-17 ENCOUNTER — Other Ambulatory Visit: Payer: Self-pay | Admitting: Geriatric Medicine

## 2021-12-17 ENCOUNTER — Ambulatory Visit
Admission: RE | Admit: 2021-12-17 | Discharge: 2021-12-17 | Disposition: A | Discharge status: Home / Self Care | Payer: Medicare Other | Source: Ambulatory Visit | Attending: Geriatric Medicine | Admitting: Geriatric Medicine

## 2021-12-17 DIAGNOSIS — R0602 Shortness of breath: Secondary | ICD-10-CM | POA: Diagnosis not present

## 2021-12-17 DIAGNOSIS — R059 Cough, unspecified: Secondary | ICD-10-CM

## 2021-12-17 DIAGNOSIS — I10 Essential (primary) hypertension: Secondary | ICD-10-CM | POA: Diagnosis not present

## 2022-01-08 DIAGNOSIS — M810 Age-related osteoporosis without current pathological fracture: Secondary | ICD-10-CM | POA: Diagnosis not present

## 2022-01-08 DIAGNOSIS — Z79899 Other long term (current) drug therapy: Secondary | ICD-10-CM | POA: Diagnosis not present

## 2022-01-15 ENCOUNTER — Ambulatory Visit (INDEPENDENT_AMBULATORY_CARE_PROVIDER_SITE_OTHER): Payer: Medicare Other

## 2022-01-15 ENCOUNTER — Encounter: Payer: Self-pay | Admitting: Podiatry

## 2022-01-15 ENCOUNTER — Ambulatory Visit (INDEPENDENT_AMBULATORY_CARE_PROVIDER_SITE_OTHER): Payer: Medicare Other | Admitting: Podiatry

## 2022-01-15 DIAGNOSIS — M779 Enthesopathy, unspecified: Secondary | ICD-10-CM

## 2022-01-15 DIAGNOSIS — M7751 Other enthesopathy of right foot: Secondary | ICD-10-CM | POA: Diagnosis not present

## 2022-01-15 DIAGNOSIS — L600 Ingrowing nail: Secondary | ICD-10-CM | POA: Diagnosis not present

## 2022-01-15 MED ORDER — TRIAMCINOLONE ACETONIDE 10 MG/ML IJ SUSP
10.0000 mg | Freq: Once | INTRAMUSCULAR | Status: AC
  Administered 2022-01-15: 10 mg

## 2022-01-15 NOTE — Patient Instructions (Signed)

## 2022-01-15 NOTE — Progress Notes (Signed)
Subjective:  ? ?Patient ID: Ashley Cabrera, female   DOB: 88 y.o.   MRN: [id number]  ? ?HPI ?Patient presents stating that the right ankle has been very sore over the last month and does not remember injury and her left big toenail has been very tender she is tried to trim it and soak it without relief and it is giving her more problems.  Patient does not smoke likes to be active ? ? ?Review of Systems  ?All other systems reviewed and are negative. ? ? ?   ?Objective:  ?Physical Exam ?Vitals and nursing note reviewed.  ?Constitutional:   ?   Appearance: She is well-developed.  ?Pulmonary:  ?   Effort: Pulmonary effort is normal.  ?Musculoskeletal:     ?   General: Normal range of motion.  ?Skin: ?   General: Skin is warm.  ?Neurological:  ?   Mental Status: She is alert.  ?  ?Neurovascular status intact muscle strength adequate range of motion within normal limits.  Patient is noted to have incurvated left hallux medial border painful when pressed and the right ankle is inflamed and sore with fluid buildup within the ankle joint itself.  Patient is found to have good digital perfusion well oriented x3 ? ?   ?Assessment:  ?Sinus tarsitis right with inflammation fluid buildup along with incurvated medial border left hallux painful when pressed moderate flatfoot deformity ? ?   ?Plan:  ?NP reviewed all conditions and discussed and at this point for the right I went ahead and I did do sterile prep and injected the sinus tarsi right 3 mg dexamethasone Kenalog 5 mg Xylocaine and advised on support shoes and for the left recommended correction of ingrown toenail allowing her to read consent form and today I went ahead and infiltrated the left hallux 60 mg Xylocaine Marcaine mixture remove the medial border exposed matrix applied phenol 3 applications 30 seconds followed by alcohol lavage sterile dressing gave instructions on soaks and reappoint to recheck.  Leave dressing on 24 hours but take it off earlier if any  throbbing were to occur ? ?X-rays indicate that there is moderate depression of the arch no indications of stress fracture or for advanced arthritis right foot and ankle ?   ? ? ?

## 2022-01-29 ENCOUNTER — Encounter: Payer: Self-pay | Admitting: Podiatry

## 2022-01-29 ENCOUNTER — Ambulatory Visit: Payer: Medicare Other | Admitting: Podiatry

## 2022-01-29 DIAGNOSIS — M778 Other enthesopathies, not elsewhere classified: Secondary | ICD-10-CM

## 2022-01-29 MED ORDER — TRIAMCINOLONE ACETONIDE 10 MG/ML IJ SUSP
10.0000 mg | Freq: Once | INTRAMUSCULAR | Status: AC
  Administered 2022-01-29: 10 mg

## 2022-01-30 NOTE — Progress Notes (Signed)
Subjective:  ? ?Patient ID: Ashley Cabrera, female   DOB: 88 y.o.   MRN: [id number]  ? ?HPI ?Patient presents stating the ankle seems improved somewhat but there is pain more in the lateral side of the foot and into the dorsal side of the foot with swelling ? ? ?ROS ? ? ?   ?Objective:  ?Physical Exam  ?Improvement the sinus tarsi right but pain is now more spreading into the extensor tendon lateral group and into the peroneal tertius type group on the lateral side of the midfoot ? ?   ?Assessment:  ?Inflammatory tendinitis improvement in capsulitis with swelling ? ?   ?Plan:  ?Sterile prep injected the lateral ankle and into the extensor tendon complex lateral side 3 mg dexamethasone Kenalog 5 mg Xylocaine and applied a ankle compression stocking to take pressure off the foot and ankle.  Reappoint to recheck ?   ? ? ?

## 2022-02-17 ENCOUNTER — Other Ambulatory Visit: Payer: Self-pay | Admitting: Geriatric Medicine

## 2022-02-17 DIAGNOSIS — Z1231 Encounter for screening mammogram for malignant neoplasm of breast: Secondary | ICD-10-CM

## 2022-02-24 DIAGNOSIS — M25571 Pain in right ankle and joints of right foot: Secondary | ICD-10-CM | POA: Diagnosis not present

## 2022-02-24 DIAGNOSIS — I10 Essential (primary) hypertension: Secondary | ICD-10-CM | POA: Diagnosis not present

## 2022-02-24 DIAGNOSIS — J301 Allergic rhinitis due to pollen: Secondary | ICD-10-CM | POA: Diagnosis not present

## 2022-03-17 ENCOUNTER — Ambulatory Visit
Admission: RE | Admit: 2022-03-17 | Discharge: 2022-03-17 | Disposition: A | Discharge status: Home / Self Care | Payer: Medicare Other | Source: Ambulatory Visit | Attending: Geriatric Medicine | Admitting: Geriatric Medicine

## 2022-03-17 DIAGNOSIS — Z1231 Encounter for screening mammogram for malignant neoplasm of breast: Secondary | ICD-10-CM | POA: Diagnosis not present

## 2022-03-18 DIAGNOSIS — I10 Essential (primary) hypertension: Secondary | ICD-10-CM | POA: Diagnosis not present

## 2022-03-18 DIAGNOSIS — Z79899 Other long term (current) drug therapy: Secondary | ICD-10-CM | POA: Diagnosis not present

## 2022-04-16 DIAGNOSIS — J4540 Moderate persistent asthma, uncomplicated: Secondary | ICD-10-CM | POA: Diagnosis not present

## 2022-04-16 DIAGNOSIS — G8929 Other chronic pain: Secondary | ICD-10-CM | POA: Diagnosis not present

## 2022-04-16 DIAGNOSIS — K219 Gastro-esophageal reflux disease without esophagitis: Secondary | ICD-10-CM | POA: Diagnosis not present

## 2022-04-16 DIAGNOSIS — I10 Essential (primary) hypertension: Secondary | ICD-10-CM | POA: Diagnosis not present

## 2022-04-16 DIAGNOSIS — M810 Age-related osteoporosis without current pathological fracture: Secondary | ICD-10-CM | POA: Diagnosis not present

## 2022-05-12 DIAGNOSIS — H04123 Dry eye syndrome of bilateral lacrimal glands: Secondary | ICD-10-CM | POA: Diagnosis not present

## 2022-05-12 DIAGNOSIS — H35033 Hypertensive retinopathy, bilateral: Secondary | ICD-10-CM | POA: Diagnosis not present

## 2022-05-12 DIAGNOSIS — Z961 Presence of intraocular lens: Secondary | ICD-10-CM | POA: Diagnosis not present

## 2022-05-12 DIAGNOSIS — H43813 Vitreous degeneration, bilateral: Secondary | ICD-10-CM | POA: Diagnosis not present

## 2022-10-07 DIAGNOSIS — H35033 Hypertensive retinopathy, bilateral: Secondary | ICD-10-CM | POA: Diagnosis not present

## 2022-10-07 DIAGNOSIS — K219 Gastro-esophageal reflux disease without esophagitis: Secondary | ICD-10-CM | POA: Diagnosis not present

## 2022-10-07 DIAGNOSIS — Z862 Personal history of diseases of the blood and blood-forming organs and certain disorders involving the immune mechanism: Secondary | ICD-10-CM | POA: Diagnosis not present

## 2022-10-07 DIAGNOSIS — I10 Essential (primary) hypertension: Secondary | ICD-10-CM | POA: Diagnosis not present

## 2022-10-07 DIAGNOSIS — M810 Age-related osteoporosis without current pathological fracture: Secondary | ICD-10-CM | POA: Diagnosis not present

## 2022-10-07 DIAGNOSIS — Z0000 Encounter for general adult medical examination without abnormal findings: Secondary | ICD-10-CM | POA: Diagnosis not present

## 2022-10-07 DIAGNOSIS — S161XXA Strain of muscle, fascia and tendon at neck level, initial encounter: Secondary | ICD-10-CM | POA: Diagnosis not present

## 2022-10-07 DIAGNOSIS — G8929 Other chronic pain: Secondary | ICD-10-CM | POA: Diagnosis not present

## 2022-10-07 DIAGNOSIS — G629 Polyneuropathy, unspecified: Secondary | ICD-10-CM | POA: Diagnosis not present

## 2022-10-07 DIAGNOSIS — J4540 Moderate persistent asthma, uncomplicated: Secondary | ICD-10-CM | POA: Diagnosis not present

## 2022-10-07 DIAGNOSIS — M545 Low back pain, unspecified: Secondary | ICD-10-CM | POA: Diagnosis not present

## 2023-02-03 DIAGNOSIS — J301 Allergic rhinitis due to pollen: Secondary | ICD-10-CM | POA: Diagnosis not present

## 2023-02-03 DIAGNOSIS — J4540 Moderate persistent asthma, uncomplicated: Secondary | ICD-10-CM | POA: Diagnosis not present

## 2023-04-28 DIAGNOSIS — G629 Polyneuropathy, unspecified: Secondary | ICD-10-CM | POA: Diagnosis not present

## 2023-04-28 DIAGNOSIS — R2689 Other abnormalities of gait and mobility: Secondary | ICD-10-CM | POA: Diagnosis not present

## 2023-04-28 DIAGNOSIS — I10 Essential (primary) hypertension: Secondary | ICD-10-CM | POA: Diagnosis not present

## 2023-04-28 DIAGNOSIS — H35033 Hypertensive retinopathy, bilateral: Secondary | ICD-10-CM | POA: Diagnosis not present

## 2023-04-28 DIAGNOSIS — K219 Gastro-esophageal reflux disease without esophagitis: Secondary | ICD-10-CM | POA: Diagnosis not present

## 2023-04-28 DIAGNOSIS — J4540 Moderate persistent asthma, uncomplicated: Secondary | ICD-10-CM | POA: Diagnosis not present

## 2023-04-28 DIAGNOSIS — M810 Age-related osteoporosis without current pathological fracture: Secondary | ICD-10-CM | POA: Diagnosis not present

## 2023-04-30 ENCOUNTER — Other Ambulatory Visit: Payer: Self-pay | Admitting: Internal Medicine

## 2023-04-30 DIAGNOSIS — Z1231 Encounter for screening mammogram for malignant neoplasm of breast: Secondary | ICD-10-CM

## 2023-05-05 ENCOUNTER — Ambulatory Visit: Admission: RE | Admit: 2023-05-05 | Payer: Medicare Other | Source: Ambulatory Visit

## 2023-05-05 DIAGNOSIS — Z1231 Encounter for screening mammogram for malignant neoplasm of breast: Secondary | ICD-10-CM

## 2023-05-18 DIAGNOSIS — H02831 Dermatochalasis of right upper eyelid: Secondary | ICD-10-CM | POA: Diagnosis not present

## 2023-05-18 DIAGNOSIS — H524 Presbyopia: Secondary | ICD-10-CM | POA: Diagnosis not present

## 2023-05-18 DIAGNOSIS — H35033 Hypertensive retinopathy, bilateral: Secondary | ICD-10-CM | POA: Diagnosis not present

## 2023-05-18 DIAGNOSIS — H02834 Dermatochalasis of left upper eyelid: Secondary | ICD-10-CM | POA: Diagnosis not present

## 2023-05-18 DIAGNOSIS — H04123 Dry eye syndrome of bilateral lacrimal glands: Secondary | ICD-10-CM | POA: Diagnosis not present

## 2023-12-07 DIAGNOSIS — M810 Age-related osteoporosis without current pathological fracture: Secondary | ICD-10-CM | POA: Diagnosis not present

## 2024-01-13 DIAGNOSIS — G629 Polyneuropathy, unspecified: Secondary | ICD-10-CM | POA: Diagnosis not present

## 2024-01-13 DIAGNOSIS — M810 Age-related osteoporosis without current pathological fracture: Secondary | ICD-10-CM | POA: Diagnosis not present

## 2024-01-13 DIAGNOSIS — Z0000 Encounter for general adult medical examination without abnormal findings: Secondary | ICD-10-CM | POA: Diagnosis not present

## 2024-01-13 DIAGNOSIS — I10 Essential (primary) hypertension: Secondary | ICD-10-CM | POA: Diagnosis not present

## 2024-01-13 DIAGNOSIS — G8929 Other chronic pain: Secondary | ICD-10-CM | POA: Diagnosis not present

## 2024-01-13 DIAGNOSIS — K219 Gastro-esophageal reflux disease without esophagitis: Secondary | ICD-10-CM | POA: Diagnosis not present

## 2024-01-13 DIAGNOSIS — Z862 Personal history of diseases of the blood and blood-forming organs and certain disorders involving the immune mechanism: Secondary | ICD-10-CM | POA: Diagnosis not present

## 2024-01-13 DIAGNOSIS — M545 Low back pain, unspecified: Secondary | ICD-10-CM | POA: Diagnosis not present

## 2024-01-13 DIAGNOSIS — H35033 Hypertensive retinopathy, bilateral: Secondary | ICD-10-CM | POA: Diagnosis not present

## 2024-01-13 DIAGNOSIS — J4540 Moderate persistent asthma, uncomplicated: Secondary | ICD-10-CM | POA: Diagnosis not present

## 2024-01-13 DIAGNOSIS — Z79899 Other long term (current) drug therapy: Secondary | ICD-10-CM | POA: Diagnosis not present

## 2024-01-14 ENCOUNTER — Other Ambulatory Visit: Payer: Self-pay | Admitting: Internal Medicine

## 2024-01-14 DIAGNOSIS — M810 Age-related osteoporosis without current pathological fracture: Secondary | ICD-10-CM

## 2024-03-27 ENCOUNTER — Other Ambulatory Visit: Payer: Self-pay | Admitting: Internal Medicine

## 2024-03-27 DIAGNOSIS — Z1231 Encounter for screening mammogram for malignant neoplasm of breast: Secondary | ICD-10-CM

## 2024-04-17 DIAGNOSIS — I10 Essential (primary) hypertension: Secondary | ICD-10-CM | POA: Diagnosis not present

## 2024-04-17 DIAGNOSIS — M810 Age-related osteoporosis without current pathological fracture: Secondary | ICD-10-CM | POA: Diagnosis not present

## 2024-04-17 DIAGNOSIS — J4540 Moderate persistent asthma, uncomplicated: Secondary | ICD-10-CM | POA: Diagnosis not present

## 2024-04-17 DIAGNOSIS — H35033 Hypertensive retinopathy, bilateral: Secondary | ICD-10-CM | POA: Diagnosis not present

## 2024-05-05 ENCOUNTER — Ambulatory Visit

## 2024-05-08 ENCOUNTER — Ambulatory Visit
Admission: RE | Admit: 2024-05-08 | Discharge: 2024-05-08 | Disposition: A | Discharge status: Home / Self Care | Source: Ambulatory Visit | Attending: Internal Medicine | Admitting: Internal Medicine

## 2024-05-08 DIAGNOSIS — Z1231 Encounter for screening mammogram for malignant neoplasm of breast: Secondary | ICD-10-CM

## 2024-05-16 DIAGNOSIS — M545 Low back pain, unspecified: Secondary | ICD-10-CM | POA: Diagnosis not present

## 2024-05-16 DIAGNOSIS — G8929 Other chronic pain: Secondary | ICD-10-CM | POA: Diagnosis not present

## 2024-05-18 DIAGNOSIS — J4540 Moderate persistent asthma, uncomplicated: Secondary | ICD-10-CM | POA: Diagnosis not present

## 2024-05-18 DIAGNOSIS — H35033 Hypertensive retinopathy, bilateral: Secondary | ICD-10-CM | POA: Diagnosis not present

## 2024-05-18 DIAGNOSIS — M810 Age-related osteoporosis without current pathological fracture: Secondary | ICD-10-CM | POA: Diagnosis not present

## 2024-05-18 DIAGNOSIS — I10 Essential (primary) hypertension: Secondary | ICD-10-CM | POA: Diagnosis not present

## 2024-06-06 DIAGNOSIS — H524 Presbyopia: Secondary | ICD-10-CM | POA: Diagnosis not present

## 2024-06-06 DIAGNOSIS — H04123 Dry eye syndrome of bilateral lacrimal glands: Secondary | ICD-10-CM | POA: Diagnosis not present

## 2024-06-06 DIAGNOSIS — H35033 Hypertensive retinopathy, bilateral: Secondary | ICD-10-CM | POA: Diagnosis not present

## 2024-06-06 DIAGNOSIS — H02831 Dermatochalasis of right upper eyelid: Secondary | ICD-10-CM | POA: Diagnosis not present

## 2024-06-06 DIAGNOSIS — D2330 Other benign neoplasm of skin of unspecified part of face: Secondary | ICD-10-CM | POA: Diagnosis not present

## 2024-06-06 DIAGNOSIS — H02834 Dermatochalasis of left upper eyelid: Secondary | ICD-10-CM | POA: Diagnosis not present

## 2024-06-07 DIAGNOSIS — M810 Age-related osteoporosis without current pathological fracture: Secondary | ICD-10-CM | POA: Diagnosis not present

## 2024-06-09 DIAGNOSIS — D171 Benign lipomatous neoplasm of skin and subcutaneous tissue of trunk: Secondary | ICD-10-CM | POA: Diagnosis not present

## 2024-06-09 DIAGNOSIS — Z79899 Other long term (current) drug therapy: Secondary | ICD-10-CM | POA: Diagnosis not present

## 2024-06-09 DIAGNOSIS — Z556 Problems related to health literacy: Secondary | ICD-10-CM | POA: Diagnosis not present

## 2024-06-09 DIAGNOSIS — K219 Gastro-esophageal reflux disease without esophagitis: Secondary | ICD-10-CM | POA: Diagnosis not present

## 2024-06-09 DIAGNOSIS — G8929 Other chronic pain: Secondary | ICD-10-CM | POA: Diagnosis not present

## 2024-06-09 DIAGNOSIS — M4800 Spinal stenosis, site unspecified: Secondary | ICD-10-CM | POA: Diagnosis not present

## 2024-06-09 DIAGNOSIS — J45909 Unspecified asthma, uncomplicated: Secondary | ICD-10-CM | POA: Diagnosis not present

## 2024-06-09 DIAGNOSIS — I10 Essential (primary) hypertension: Secondary | ICD-10-CM | POA: Diagnosis not present

## 2024-06-09 DIAGNOSIS — G629 Polyneuropathy, unspecified: Secondary | ICD-10-CM | POA: Diagnosis not present

## 2024-06-09 DIAGNOSIS — K5790 Diverticulosis of intestine, part unspecified, without perforation or abscess without bleeding: Secondary | ICD-10-CM | POA: Diagnosis not present

## 2024-06-09 DIAGNOSIS — M545 Low back pain, unspecified: Secondary | ICD-10-CM | POA: Diagnosis not present

## 2024-06-09 DIAGNOSIS — M810 Age-related osteoporosis without current pathological fracture: Secondary | ICD-10-CM | POA: Diagnosis not present

## 2024-06-09 DIAGNOSIS — D1724 Benign lipomatous neoplasm of skin and subcutaneous tissue of left leg: Secondary | ICD-10-CM | POA: Diagnosis not present

## 2024-06-09 DIAGNOSIS — D509 Iron deficiency anemia, unspecified: Secondary | ICD-10-CM | POA: Diagnosis not present

## 2024-06-18 DIAGNOSIS — I10 Essential (primary) hypertension: Secondary | ICD-10-CM | POA: Diagnosis not present

## 2024-06-18 DIAGNOSIS — H35033 Hypertensive retinopathy, bilateral: Secondary | ICD-10-CM | POA: Diagnosis not present

## 2024-06-18 DIAGNOSIS — J4540 Moderate persistent asthma, uncomplicated: Secondary | ICD-10-CM | POA: Diagnosis not present

## 2024-06-18 DIAGNOSIS — M810 Age-related osteoporosis without current pathological fracture: Secondary | ICD-10-CM | POA: Diagnosis not present

## 2024-06-22 ENCOUNTER — Other Ambulatory Visit (HOSPITAL_BASED_OUTPATIENT_CLINIC_OR_DEPARTMENT_OTHER)

## 2024-07-11 DIAGNOSIS — G8929 Other chronic pain: Secondary | ICD-10-CM | POA: Diagnosis not present

## 2024-07-11 DIAGNOSIS — M545 Low back pain, unspecified: Secondary | ICD-10-CM | POA: Diagnosis not present

## 2024-07-11 DIAGNOSIS — J45909 Unspecified asthma, uncomplicated: Secondary | ICD-10-CM | POA: Diagnosis not present

## 2024-07-11 DIAGNOSIS — G629 Polyneuropathy, unspecified: Secondary | ICD-10-CM | POA: Diagnosis not present

## 2024-07-11 DIAGNOSIS — Z79899 Other long term (current) drug therapy: Secondary | ICD-10-CM | POA: Diagnosis not present

## 2024-07-11 DIAGNOSIS — K5790 Diverticulosis of intestine, part unspecified, without perforation or abscess without bleeding: Secondary | ICD-10-CM | POA: Diagnosis not present

## 2024-07-11 DIAGNOSIS — M4800 Spinal stenosis, site unspecified: Secondary | ICD-10-CM | POA: Diagnosis not present

## 2024-07-11 DIAGNOSIS — K219 Gastro-esophageal reflux disease without esophagitis: Secondary | ICD-10-CM | POA: Diagnosis not present

## 2024-07-11 DIAGNOSIS — D1724 Benign lipomatous neoplasm of skin and subcutaneous tissue of left leg: Secondary | ICD-10-CM | POA: Diagnosis not present

## 2024-07-11 DIAGNOSIS — I10 Essential (primary) hypertension: Secondary | ICD-10-CM | POA: Diagnosis not present

## 2024-07-11 DIAGNOSIS — Z556 Problems related to health literacy: Secondary | ICD-10-CM | POA: Diagnosis not present

## 2024-07-11 DIAGNOSIS — M810 Age-related osteoporosis without current pathological fracture: Secondary | ICD-10-CM | POA: Diagnosis not present

## 2024-07-11 DIAGNOSIS — D171 Benign lipomatous neoplasm of skin and subcutaneous tissue of trunk: Secondary | ICD-10-CM | POA: Diagnosis not present

## 2024-07-11 DIAGNOSIS — D509 Iron deficiency anemia, unspecified: Secondary | ICD-10-CM | POA: Diagnosis not present

## 2024-07-12 DIAGNOSIS — D485 Neoplasm of uncertain behavior of skin: Secondary | ICD-10-CM | POA: Diagnosis not present

## 2024-07-12 DIAGNOSIS — H0279 Other degenerative disorders of eyelid and periocular area: Secondary | ICD-10-CM | POA: Diagnosis not present

## 2024-07-12 DIAGNOSIS — H02845 Edema of left lower eyelid: Secondary | ICD-10-CM | POA: Diagnosis not present

## 2024-07-18 DIAGNOSIS — H35033 Hypertensive retinopathy, bilateral: Secondary | ICD-10-CM | POA: Diagnosis not present

## 2024-07-18 DIAGNOSIS — M810 Age-related osteoporosis without current pathological fracture: Secondary | ICD-10-CM | POA: Diagnosis not present

## 2024-07-18 DIAGNOSIS — I10 Essential (primary) hypertension: Secondary | ICD-10-CM | POA: Diagnosis not present

## 2024-07-18 DIAGNOSIS — J4540 Moderate persistent asthma, uncomplicated: Secondary | ICD-10-CM | POA: Diagnosis not present

## 2024-07-20 DIAGNOSIS — D509 Iron deficiency anemia, unspecified: Secondary | ICD-10-CM | POA: Diagnosis not present

## 2024-07-20 DIAGNOSIS — M810 Age-related osteoporosis without current pathological fracture: Secondary | ICD-10-CM | POA: Diagnosis not present

## 2024-07-20 DIAGNOSIS — D171 Benign lipomatous neoplasm of skin and subcutaneous tissue of trunk: Secondary | ICD-10-CM | POA: Diagnosis not present

## 2024-07-20 DIAGNOSIS — J45909 Unspecified asthma, uncomplicated: Secondary | ICD-10-CM | POA: Diagnosis not present

## 2024-07-20 DIAGNOSIS — Z79899 Other long term (current) drug therapy: Secondary | ICD-10-CM | POA: Diagnosis not present

## 2024-07-20 DIAGNOSIS — G629 Polyneuropathy, unspecified: Secondary | ICD-10-CM | POA: Diagnosis not present

## 2024-07-20 DIAGNOSIS — I10 Essential (primary) hypertension: Secondary | ICD-10-CM | POA: Diagnosis not present

## 2024-07-20 DIAGNOSIS — G8929 Other chronic pain: Secondary | ICD-10-CM | POA: Diagnosis not present

## 2024-07-20 DIAGNOSIS — K5790 Diverticulosis of intestine, part unspecified, without perforation or abscess without bleeding: Secondary | ICD-10-CM | POA: Diagnosis not present

## 2024-07-20 DIAGNOSIS — K219 Gastro-esophageal reflux disease without esophagitis: Secondary | ICD-10-CM | POA: Diagnosis not present

## 2024-07-20 DIAGNOSIS — D1724 Benign lipomatous neoplasm of skin and subcutaneous tissue of left leg: Secondary | ICD-10-CM | POA: Diagnosis not present

## 2024-07-20 DIAGNOSIS — Z556 Problems related to health literacy: Secondary | ICD-10-CM | POA: Diagnosis not present

## 2024-07-20 DIAGNOSIS — M4800 Spinal stenosis, site unspecified: Secondary | ICD-10-CM | POA: Diagnosis not present

## 2024-07-20 DIAGNOSIS — M545 Low back pain, unspecified: Secondary | ICD-10-CM | POA: Diagnosis not present

## 2024-07-25 DIAGNOSIS — I10 Essential (primary) hypertension: Secondary | ICD-10-CM | POA: Diagnosis not present

## 2024-07-25 DIAGNOSIS — M545 Low back pain, unspecified: Secondary | ICD-10-CM | POA: Diagnosis not present

## 2024-07-25 DIAGNOSIS — G629 Polyneuropathy, unspecified: Secondary | ICD-10-CM | POA: Diagnosis not present

## 2024-07-26 DIAGNOSIS — K219 Gastro-esophageal reflux disease without esophagitis: Secondary | ICD-10-CM | POA: Diagnosis not present

## 2024-07-26 DIAGNOSIS — I10 Essential (primary) hypertension: Secondary | ICD-10-CM | POA: Diagnosis not present

## 2024-07-26 DIAGNOSIS — D1724 Benign lipomatous neoplasm of skin and subcutaneous tissue of left leg: Secondary | ICD-10-CM | POA: Diagnosis not present

## 2024-07-26 DIAGNOSIS — J45909 Unspecified asthma, uncomplicated: Secondary | ICD-10-CM | POA: Diagnosis not present

## 2024-07-26 DIAGNOSIS — M545 Low back pain, unspecified: Secondary | ICD-10-CM | POA: Diagnosis not present

## 2024-07-26 DIAGNOSIS — G629 Polyneuropathy, unspecified: Secondary | ICD-10-CM | POA: Diagnosis not present

## 2024-07-26 DIAGNOSIS — K5790 Diverticulosis of intestine, part unspecified, without perforation or abscess without bleeding: Secondary | ICD-10-CM | POA: Diagnosis not present

## 2024-07-26 DIAGNOSIS — Z556 Problems related to health literacy: Secondary | ICD-10-CM | POA: Diagnosis not present

## 2024-07-26 DIAGNOSIS — G8929 Other chronic pain: Secondary | ICD-10-CM | POA: Diagnosis not present

## 2024-07-26 DIAGNOSIS — M4800 Spinal stenosis, site unspecified: Secondary | ICD-10-CM | POA: Diagnosis not present

## 2024-07-26 DIAGNOSIS — Z79899 Other long term (current) drug therapy: Secondary | ICD-10-CM | POA: Diagnosis not present

## 2024-07-26 DIAGNOSIS — D509 Iron deficiency anemia, unspecified: Secondary | ICD-10-CM | POA: Diagnosis not present

## 2024-07-26 DIAGNOSIS — D171 Benign lipomatous neoplasm of skin and subcutaneous tissue of trunk: Secondary | ICD-10-CM | POA: Diagnosis not present

## 2024-07-26 DIAGNOSIS — M810 Age-related osteoporosis without current pathological fracture: Secondary | ICD-10-CM | POA: Diagnosis not present

## 2024-08-04 DIAGNOSIS — G8929 Other chronic pain: Secondary | ICD-10-CM | POA: Diagnosis not present

## 2024-08-04 DIAGNOSIS — K5790 Diverticulosis of intestine, part unspecified, without perforation or abscess without bleeding: Secondary | ICD-10-CM | POA: Diagnosis not present

## 2024-08-04 DIAGNOSIS — G629 Polyneuropathy, unspecified: Secondary | ICD-10-CM | POA: Diagnosis not present

## 2024-08-04 DIAGNOSIS — Z556 Problems related to health literacy: Secondary | ICD-10-CM | POA: Diagnosis not present

## 2024-08-04 DIAGNOSIS — M810 Age-related osteoporosis without current pathological fracture: Secondary | ICD-10-CM | POA: Diagnosis not present

## 2024-08-04 DIAGNOSIS — J45909 Unspecified asthma, uncomplicated: Secondary | ICD-10-CM | POA: Diagnosis not present

## 2024-08-04 DIAGNOSIS — M545 Low back pain, unspecified: Secondary | ICD-10-CM | POA: Diagnosis not present

## 2024-08-04 DIAGNOSIS — K219 Gastro-esophageal reflux disease without esophagitis: Secondary | ICD-10-CM | POA: Diagnosis not present

## 2024-08-04 DIAGNOSIS — M4800 Spinal stenosis, site unspecified: Secondary | ICD-10-CM | POA: Diagnosis not present

## 2024-08-04 DIAGNOSIS — D1724 Benign lipomatous neoplasm of skin and subcutaneous tissue of left leg: Secondary | ICD-10-CM | POA: Diagnosis not present

## 2024-08-04 DIAGNOSIS — I10 Essential (primary) hypertension: Secondary | ICD-10-CM | POA: Diagnosis not present

## 2024-08-04 DIAGNOSIS — Z79899 Other long term (current) drug therapy: Secondary | ICD-10-CM | POA: Diagnosis not present

## 2024-08-04 DIAGNOSIS — D171 Benign lipomatous neoplasm of skin and subcutaneous tissue of trunk: Secondary | ICD-10-CM | POA: Diagnosis not present

## 2024-08-04 DIAGNOSIS — D509 Iron deficiency anemia, unspecified: Secondary | ICD-10-CM | POA: Diagnosis not present

## 2025-02-07 ENCOUNTER — Other Ambulatory Visit (HOSPITAL_BASED_OUTPATIENT_CLINIC_OR_DEPARTMENT_OTHER)
